# Patient Record
Sex: Male | Born: 1950
Health system: Southern US, Community
[De-identification: ages and names within clinical notes are randomized; demographics above are authoritative.]

## PROBLEM LIST (undated history)

## (undated) DIAGNOSIS — M1990 Unspecified osteoarthritis, unspecified site: Secondary | ICD-10-CM

## (undated) DIAGNOSIS — J189 Pneumonia, unspecified organism: Secondary | ICD-10-CM

## (undated) DIAGNOSIS — K219 Gastro-esophageal reflux disease without esophagitis: Secondary | ICD-10-CM

## (undated) DIAGNOSIS — H409 Unspecified glaucoma: Secondary | ICD-10-CM

## (undated) DIAGNOSIS — H269 Unspecified cataract: Secondary | ICD-10-CM

## (undated) DIAGNOSIS — E785 Hyperlipidemia, unspecified: Secondary | ICD-10-CM

## (undated) DIAGNOSIS — F419 Anxiety disorder, unspecified: Secondary | ICD-10-CM

## (undated) DIAGNOSIS — Z8709 Personal history of other diseases of the respiratory system: Secondary | ICD-10-CM

## (undated) DIAGNOSIS — H3530 Unspecified macular degeneration: Secondary | ICD-10-CM

## (undated) DIAGNOSIS — M2550 Pain in unspecified joint: Secondary | ICD-10-CM

## (undated) DIAGNOSIS — I10 Essential (primary) hypertension: Secondary | ICD-10-CM

## (undated) HISTORY — PX: OTHER SURGICAL HISTORY: SHX169

## (undated) HISTORY — PX: HEMORROIDECTOMY: SUR656

## (undated) HISTORY — PX: ESOPHAGOGASTRODUODENOSCOPY: SHX1529

## (undated) HISTORY — PX: COLONOSCOPY: SHX174

## (undated) HISTORY — PX: RETINAL DETACHMENT SURGERY: SHX105

---

## 1988-09-28 HISTORY — PX: NASAL SINUS SURGERY: SHX719

## 2010-09-28 DIAGNOSIS — J189 Pneumonia, unspecified organism: Secondary | ICD-10-CM

## 2010-09-28 HISTORY — DX: Pneumonia, unspecified organism: J18.9

## 2013-02-09 DIAGNOSIS — R5381 Other malaise: Secondary | ICD-10-CM | POA: Diagnosis not present

## 2013-02-09 DIAGNOSIS — B9789 Other viral agents as the cause of diseases classified elsewhere: Secondary | ICD-10-CM | POA: Diagnosis not present

## 2013-02-09 DIAGNOSIS — E785 Hyperlipidemia, unspecified: Secondary | ICD-10-CM | POA: Diagnosis not present

## 2013-02-09 DIAGNOSIS — I10 Essential (primary) hypertension: Secondary | ICD-10-CM | POA: Diagnosis not present

## 2013-02-10 DIAGNOSIS — I10 Essential (primary) hypertension: Secondary | ICD-10-CM | POA: Diagnosis not present

## 2013-02-10 DIAGNOSIS — E119 Type 2 diabetes mellitus without complications: Secondary | ICD-10-CM | POA: Diagnosis not present

## 2013-02-10 DIAGNOSIS — D649 Anemia, unspecified: Secondary | ICD-10-CM | POA: Diagnosis not present

## 2013-02-10 DIAGNOSIS — N390 Urinary tract infection, site not specified: Secondary | ICD-10-CM | POA: Diagnosis not present

## 2013-02-10 DIAGNOSIS — E039 Hypothyroidism, unspecified: Secondary | ICD-10-CM | POA: Diagnosis not present

## 2013-02-10 DIAGNOSIS — B9789 Other viral agents as the cause of diseases classified elsewhere: Secondary | ICD-10-CM | POA: Diagnosis not present

## 2013-04-13 DIAGNOSIS — H2510 Age-related nuclear cataract, unspecified eye: Secondary | ICD-10-CM | POA: Diagnosis not present

## 2013-04-13 DIAGNOSIS — H401290 Low-tension glaucoma, unspecified eye, stage unspecified: Secondary | ICD-10-CM | POA: Diagnosis not present

## 2014-05-30 ENCOUNTER — Other Ambulatory Visit: Payer: Self-pay | Admitting: Geriatric Medicine

## 2014-05-30 DIAGNOSIS — Z87891 Personal history of nicotine dependence: Secondary | ICD-10-CM | POA: Diagnosis not present

## 2014-05-30 DIAGNOSIS — R413 Other amnesia: Secondary | ICD-10-CM | POA: Diagnosis not present

## 2014-05-30 DIAGNOSIS — Z79899 Other long term (current) drug therapy: Secondary | ICD-10-CM | POA: Diagnosis not present

## 2014-05-30 DIAGNOSIS — K219 Gastro-esophageal reflux disease without esophagitis: Secondary | ICD-10-CM | POA: Diagnosis not present

## 2014-05-30 DIAGNOSIS — M810 Age-related osteoporosis without current pathological fracture: Secondary | ICD-10-CM | POA: Diagnosis not present

## 2014-05-30 DIAGNOSIS — E7800 Pure hypercholesterolemia, unspecified: Secondary | ICD-10-CM | POA: Diagnosis not present

## 2014-05-30 DIAGNOSIS — R109 Unspecified abdominal pain: Secondary | ICD-10-CM | POA: Diagnosis not present

## 2014-05-30 DIAGNOSIS — I10 Essential (primary) hypertension: Secondary | ICD-10-CM | POA: Diagnosis not present

## 2014-05-31 DIAGNOSIS — M1710 Unilateral primary osteoarthritis, unspecified knee: Secondary | ICD-10-CM | POA: Diagnosis not present

## 2014-05-31 DIAGNOSIS — IMO0002 Reserved for concepts with insufficient information to code with codable children: Secondary | ICD-10-CM | POA: Diagnosis not present

## 2014-05-31 DIAGNOSIS — M25469 Effusion, unspecified knee: Secondary | ICD-10-CM | POA: Diagnosis not present

## 2014-05-31 DIAGNOSIS — M25569 Pain in unspecified knee: Secondary | ICD-10-CM | POA: Diagnosis not present

## 2014-06-01 DIAGNOSIS — E7800 Pure hypercholesterolemia, unspecified: Secondary | ICD-10-CM | POA: Diagnosis not present

## 2014-06-01 DIAGNOSIS — R109 Unspecified abdominal pain: Secondary | ICD-10-CM | POA: Diagnosis not present

## 2014-06-01 DIAGNOSIS — I10 Essential (primary) hypertension: Secondary | ICD-10-CM | POA: Diagnosis not present

## 2014-06-01 DIAGNOSIS — Z79899 Other long term (current) drug therapy: Secondary | ICD-10-CM | POA: Diagnosis not present

## 2014-06-02 DIAGNOSIS — B029 Zoster without complications: Secondary | ICD-10-CM | POA: Diagnosis not present

## 2014-06-05 ENCOUNTER — Ambulatory Visit: Payer: Self-pay

## 2014-06-06 DIAGNOSIS — B0232 Zoster iridocyclitis: Secondary | ICD-10-CM | POA: Diagnosis not present

## 2014-06-11 DIAGNOSIS — M810 Age-related osteoporosis without current pathological fracture: Secondary | ICD-10-CM | POA: Diagnosis not present

## 2014-06-13 ENCOUNTER — Ambulatory Visit: Payer: Self-pay

## 2014-06-14 ENCOUNTER — Encounter (INDEPENDENT_AMBULATORY_CARE_PROVIDER_SITE_OTHER): Payer: Self-pay

## 2014-06-14 ENCOUNTER — Ambulatory Visit
Admission: RE | Admit: 2014-06-14 | Discharge: 2014-06-14 | Disposition: A | Discharge status: Home / Self Care | Payer: Medicare Other | Source: Ambulatory Visit | Attending: Geriatric Medicine | Admitting: Geriatric Medicine

## 2014-06-14 DIAGNOSIS — Z87891 Personal history of nicotine dependence: Secondary | ICD-10-CM

## 2014-06-14 DIAGNOSIS — Z136 Encounter for screening for cardiovascular disorders: Secondary | ICD-10-CM | POA: Diagnosis not present

## 2014-06-15 DIAGNOSIS — Z23 Encounter for immunization: Secondary | ICD-10-CM | POA: Diagnosis not present

## 2014-06-20 DIAGNOSIS — K219 Gastro-esophageal reflux disease without esophagitis: Secondary | ICD-10-CM | POA: Diagnosis not present

## 2014-06-20 DIAGNOSIS — M949 Disorder of cartilage, unspecified: Secondary | ICD-10-CM | POA: Diagnosis not present

## 2014-06-20 DIAGNOSIS — M899 Disorder of bone, unspecified: Secondary | ICD-10-CM | POA: Diagnosis not present

## 2014-06-20 DIAGNOSIS — Z79899 Other long term (current) drug therapy: Secondary | ICD-10-CM | POA: Diagnosis not present

## 2014-06-20 DIAGNOSIS — I10 Essential (primary) hypertension: Secondary | ICD-10-CM | POA: Diagnosis not present

## 2014-06-20 DIAGNOSIS — R413 Other amnesia: Secondary | ICD-10-CM | POA: Diagnosis not present

## 2014-06-20 DIAGNOSIS — M1990 Unspecified osteoarthritis, unspecified site: Secondary | ICD-10-CM | POA: Diagnosis not present

## 2014-06-22 DIAGNOSIS — M1710 Unilateral primary osteoarthritis, unspecified knee: Secondary | ICD-10-CM | POA: Diagnosis not present

## 2015-01-21 DIAGNOSIS — R1013 Epigastric pain: Secondary | ICD-10-CM | POA: Diagnosis not present

## 2015-01-21 DIAGNOSIS — M25562 Pain in left knee: Secondary | ICD-10-CM | POA: Diagnosis not present

## 2015-01-21 DIAGNOSIS — Z79899 Other long term (current) drug therapy: Secondary | ICD-10-CM | POA: Diagnosis not present

## 2015-01-21 DIAGNOSIS — I10 Essential (primary) hypertension: Secondary | ICD-10-CM | POA: Diagnosis not present

## 2015-01-21 DIAGNOSIS — E780 Pure hypercholesterolemia: Secondary | ICD-10-CM | POA: Diagnosis not present

## 2015-01-22 DIAGNOSIS — M1711 Unilateral primary osteoarthritis, right knee: Secondary | ICD-10-CM | POA: Diagnosis not present

## 2015-01-22 DIAGNOSIS — M1712 Unilateral primary osteoarthritis, left knee: Secondary | ICD-10-CM | POA: Diagnosis not present

## 2015-01-22 DIAGNOSIS — M25562 Pain in left knee: Secondary | ICD-10-CM | POA: Diagnosis not present

## 2015-01-22 DIAGNOSIS — M25561 Pain in right knee: Secondary | ICD-10-CM | POA: Diagnosis not present

## 2015-01-25 DIAGNOSIS — M1712 Unilateral primary osteoarthritis, left knee: Secondary | ICD-10-CM | POA: Diagnosis not present

## 2015-01-27 ENCOUNTER — Other Ambulatory Visit: Payer: Self-pay | Admitting: Orthopedic Surgery

## 2015-01-28 ENCOUNTER — Ambulatory Visit (HOSPITAL_COMMUNITY)
Admission: RE | Admit: 2015-01-28 | Discharge: 2015-01-28 | Disposition: A | Discharge status: Home / Self Care | Payer: Medicare Other | Source: Ambulatory Visit | Attending: Orthopedic Surgery | Admitting: Orthopedic Surgery

## 2015-01-28 ENCOUNTER — Encounter (HOSPITAL_COMMUNITY): Payer: Self-pay

## 2015-01-28 ENCOUNTER — Encounter (HOSPITAL_COMMUNITY)
Admission: RE | Admit: 2015-01-28 | Discharge: 2015-01-28 | Disposition: A | Discharge status: Home / Self Care | Payer: Medicare Other | Source: Ambulatory Visit | Attending: Orthopedic Surgery | Admitting: Orthopedic Surgery

## 2015-01-28 DIAGNOSIS — M1712 Unilateral primary osteoarthritis, left knee: Secondary | ICD-10-CM | POA: Diagnosis not present

## 2015-01-28 DIAGNOSIS — R9431 Abnormal electrocardiogram [ECG] [EKG]: Secondary | ICD-10-CM | POA: Diagnosis not present

## 2015-01-28 DIAGNOSIS — I498 Other specified cardiac arrhythmias: Secondary | ICD-10-CM | POA: Insufficient documentation

## 2015-01-28 DIAGNOSIS — Z01818 Encounter for other preprocedural examination: Secondary | ICD-10-CM

## 2015-01-28 DIAGNOSIS — Z01812 Encounter for preprocedural laboratory examination: Secondary | ICD-10-CM | POA: Diagnosis not present

## 2015-01-28 DIAGNOSIS — Z01811 Encounter for preprocedural respiratory examination: Secondary | ICD-10-CM | POA: Diagnosis not present

## 2015-01-28 HISTORY — DX: Unspecified osteoarthritis, unspecified site: M19.90

## 2015-01-28 HISTORY — DX: Personal history of other diseases of the respiratory system: Z87.09

## 2015-01-28 HISTORY — DX: Unspecified cataract: H26.9

## 2015-01-28 HISTORY — DX: Essential (primary) hypertension: I10

## 2015-01-28 HISTORY — DX: Unspecified glaucoma: H40.9

## 2015-01-28 HISTORY — DX: Pain in unspecified joint: M25.50

## 2015-01-28 HISTORY — DX: Gastro-esophageal reflux disease without esophagitis: K21.9

## 2015-01-28 HISTORY — DX: Pneumonia, unspecified organism: J18.9

## 2015-01-28 HISTORY — DX: Hyperlipidemia, unspecified: E78.5

## 2015-01-28 HISTORY — DX: Unspecified macular degeneration: H35.30

## 2015-01-28 HISTORY — DX: Anxiety disorder, unspecified: F41.9

## 2015-01-28 LAB — CBC WITH DIFFERENTIAL/PLATELET
BASOS PCT: 1 % (ref 0–1)
Basophils Absolute: 0.1 10*3/uL (ref 0.0–0.1)
EOS PCT: 8 % — AB (ref 0–5)
Eosinophils Absolute: 0.5 10*3/uL (ref 0.0–0.7)
HCT: 42.7 % (ref 39.0–52.0)
HEMOGLOBIN: 14.2 g/dL (ref 13.0–17.0)
LYMPHS ABS: 1.8 10*3/uL (ref 0.7–4.0)
Lymphocytes Relative: 30 % (ref 12–46)
MCH: 30.7 pg (ref 26.0–34.0)
MCHC: 33.3 g/dL (ref 30.0–36.0)
MCV: 92.2 fL (ref 78.0–100.0)
Monocytes Absolute: 0.4 10*3/uL (ref 0.1–1.0)
Monocytes Relative: 7 % (ref 3–12)
Neutro Abs: 3.3 10*3/uL (ref 1.7–7.7)
Neutrophils Relative %: 54 % (ref 43–77)
Platelets: 250 10*3/uL (ref 150–400)
RBC: 4.63 MIL/uL (ref 4.22–5.81)
RDW: 12.8 % (ref 11.5–15.5)
WBC: 6 10*3/uL (ref 4.0–10.5)

## 2015-01-28 LAB — URINE MICROSCOPIC-ADD ON

## 2015-01-28 LAB — COMPREHENSIVE METABOLIC PANEL
ALBUMIN: 3.6 g/dL (ref 3.5–5.0)
ALT: 27 U/L (ref 17–63)
AST: 27 U/L (ref 15–41)
Alkaline Phosphatase: 66 U/L (ref 38–126)
Anion gap: 6 (ref 5–15)
BUN: 8 mg/dL (ref 6–20)
CO2: 26 mmol/L (ref 22–32)
CREATININE: 0.98 mg/dL (ref 0.61–1.24)
Calcium: 9.1 mg/dL (ref 8.9–10.3)
Chloride: 104 mmol/L (ref 101–111)
GFR calc non Af Amer: >60 mL/min (ref >60)
GLUCOSE: 103 mg/dL — AB (ref 70–99)
Potassium: 4.1 mmol/L (ref 3.5–5.1)
Sodium: 136 mmol/L (ref 135–145)
TOTAL PROTEIN: 7.4 g/dL (ref 6.5–8.1)
Total Bilirubin: 0.7 mg/dL (ref 0.3–1.2)

## 2015-01-28 LAB — URINALYSIS, ROUTINE W REFLEX MICROSCOPIC
Bilirubin Urine: NEGATIVE
Glucose, UA: NEGATIVE mg/dL
Hgb urine dipstick: NEGATIVE
KETONES UR: NEGATIVE mg/dL
Nitrite: NEGATIVE
Protein, ur: NEGATIVE mg/dL
Specific Gravity, Urine: 1.003 — ABNORMAL LOW (ref 1.005–1.030)
UROBILINOGEN UA: 0.2 mg/dL (ref 0.0–1.0)
pH: 6.5 (ref 5.0–8.0)

## 2015-01-28 LAB — SURGICAL PCR SCREEN
MRSA, PCR: NEGATIVE
Staphylococcus aureus: NEGATIVE

## 2015-01-28 LAB — APTT: aPTT: 30 seconds (ref 24–37)

## 2015-01-28 LAB — PROTIME-INR
INR: 1.08 (ref 0.00–1.49)
PROTHROMBIN TIME: 14.1 s (ref 11.6–15.2)

## 2015-01-28 MED ORDER — CHLORHEXIDINE GLUCONATE 4 % EX LIQD
60.0000 mL | Freq: Once | CUTANEOUS | Status: DC
  Filled 2015-01-28: qty 60

## 2015-01-28 NOTE — Progress Notes (Signed)
Pt doesn't have a cardiologist  Denies ever having an echo/stress test/heart cath  Denies EKG or CXR in past yr  Medical Md is DR.Middleton

## 2015-01-28 NOTE — Pre-Procedure Instructions (Signed)
Hunter Crane  01/28/2015   Your procedure is scheduled on:  Mon, May 9 @ 8:45 AM  Report to [facility]  at 6:45 AM.  Call this number if you have problems the morning of surgery: [phone]   Remember:   Do not eat food or drink liquids after midnight.   Take these medicines the morning of surgery with A SIP OF WATER: Amlodipine(Norvasc)              Stop taking your Aspirin,Fish Oil,and any Vitamins. No Goody's,BC's,Aleve,Ibuprofen,or Herbal Medications.    Do not wear jewelry.  Do not wear lotions, powders, or colognes. You may wear deodorant.  Men may shave face and neck.  Do not bring valuables to the hospital.  [facility] is not responsible                  for any belongings or valuables.               Contacts, dentures or bridgework may not be worn into surgery.  Leave suitcase in the car. After surgery it may be brought to your room.  For patients admitted to the hospital, discharge time is determined by your                treatment team.                 Special Instructions:  [place] - Preparing for Surgery  Before surgery, you can play an important role.  Because skin is not sterile, your skin needs to be as free of germs as possible.  You can reduce the number of germs on you skin by washing with CHG (chlorahexidine gluconate) soap before surgery.  CHG is an antiseptic cleaner which kills germs and bonds with the skin to continue killing germs even after washing.  Please DO NOT use if you have an allergy to CHG or antibacterial soaps.  If your skin becomes reddened/irritated stop using the CHG and inform your nurse when you arrive at Short Stay.  Do not shave (including legs and underarms) for at least 48 hours prior to the first CHG shower.  You may shave your face.  Please follow these instructions carefully:   1.  Shower with CHG Soap the night before surgery and the                                morning of Surgery.  2.  If you choose to wash  your hair, wash your hair first as usual with your       normal shampoo.  3.  After you shampoo, rinse your hair and body thoroughly to remove the                      Shampoo.  4.  Use CHG as you would any other liquid soap.  You can apply chg directly       to the skin and wash gently with scrungie or a clean washcloth.  5.  Apply the CHG Soap to your body ONLY FROM THE NECK DOWN.        Do not use on open wounds or open sores.  Avoid contact with your eyes,       ears, mouth and genitals (private parts).  Wash genitals (private parts)       with your normal soap.  6.  Wash  thoroughly, paying special attention to the area where your surgery        will be performed.  7.  Thoroughly rinse your body with warm water from the neck down.  8.  DO NOT shower/wash with your normal soap after using and rinsing off       the CHG Soap.  9.  Pat yourself dry with a clean towel.            10.  Wear clean pajamas.            11.  Place clean sheets on your bed the night of your first shower and do not        sleep with pets.  Day of Surgery  Do not apply any lotions/deoderants the morning of surgery.  Please wear clean clothes to the hospital/surgery center.     Please read over the following fact sheets that you were given: Pain Booklet, Coughing and Deep Breathing, MRSA Information and Surgical Site Infection Prevention

## 2015-01-29 LAB — URINE CULTURE
COLONY COUNT: NO GROWTH
Culture: NO GROWTH

## 2015-02-01 MED ORDER — CEFAZOLIN SODIUM-DEXTROSE 2-3 GM-% IV SOLR
2.0000 g | INTRAVENOUS | Status: AC
  Administered 2015-02-04: 2 g via INTRAVENOUS
  Filled 2015-02-01: qty 50

## 2015-02-01 MED ORDER — TRANEXAMIC ACID 1000 MG/10ML IV SOLN
1000.0000 mg | INTRAVENOUS | Status: AC
  Administered 2015-02-04: 1000 mg via INTRAVENOUS
  Filled 2015-02-01: qty 10

## 2015-02-01 MED ORDER — BUPIVACAINE LIPOSOME 1.3 % IJ SUSP
20.0000 mL | INTRAMUSCULAR | Status: AC
  Administered 2015-02-04: 20 mL
  Filled 2015-02-01: qty 20

## 2015-02-01 MED ORDER — SODIUM CHLORIDE 0.9 % IV SOLN: INTRAVENOUS | Status: DC

## 2015-02-04 ENCOUNTER — Encounter (HOSPITAL_COMMUNITY): Payer: Self-pay | Admitting: Anesthesiology

## 2015-02-04 ENCOUNTER — Inpatient Hospital Stay (HOSPITAL_COMMUNITY)
Admission: RE | Admit: 2015-02-04 | Discharge: 2015-02-05 | DRG: 470 | Disposition: A | Discharge status: Home Health | Payer: Medicare Other | Source: Ambulatory Visit | Attending: Orthopedic Surgery | Admitting: Orthopedic Surgery

## 2015-02-04 ENCOUNTER — Encounter (HOSPITAL_COMMUNITY)
Admission: RE | Disposition: A | Discharge status: Home / Self Care | Payer: Self-pay | Source: Ambulatory Visit | Attending: Orthopedic Surgery

## 2015-02-04 ENCOUNTER — Inpatient Hospital Stay (HOSPITAL_COMMUNITY): Payer: Medicare Other | Admitting: Anesthesiology

## 2015-02-04 DIAGNOSIS — D62 Acute posthemorrhagic anemia: Secondary | ICD-10-CM | POA: Diagnosis not present

## 2015-02-04 DIAGNOSIS — Z79899 Other long term (current) drug therapy: Secondary | ICD-10-CM | POA: Diagnosis not present

## 2015-02-04 DIAGNOSIS — H3530 Unspecified macular degeneration: Secondary | ICD-10-CM | POA: Diagnosis present

## 2015-02-04 DIAGNOSIS — Z7982 Long term (current) use of aspirin: Secondary | ICD-10-CM | POA: Diagnosis not present

## 2015-02-04 DIAGNOSIS — I10 Essential (primary) hypertension: Secondary | ICD-10-CM | POA: Diagnosis present

## 2015-02-04 DIAGNOSIS — E785 Hyperlipidemia, unspecified: Secondary | ICD-10-CM | POA: Diagnosis present

## 2015-02-04 DIAGNOSIS — M1712 Unilateral primary osteoarthritis, left knee: Principal | ICD-10-CM | POA: Diagnosis present

## 2015-02-04 DIAGNOSIS — F419 Anxiety disorder, unspecified: Secondary | ICD-10-CM | POA: Diagnosis present

## 2015-02-04 DIAGNOSIS — H409 Unspecified glaucoma: Secondary | ICD-10-CM | POA: Diagnosis present

## 2015-02-04 DIAGNOSIS — G8918 Other acute postprocedural pain: Secondary | ICD-10-CM | POA: Diagnosis not present

## 2015-02-04 DIAGNOSIS — Z96659 Presence of unspecified artificial knee joint: Secondary | ICD-10-CM

## 2015-02-04 DIAGNOSIS — M179 Osteoarthritis of knee, unspecified: Secondary | ICD-10-CM | POA: Diagnosis not present

## 2015-02-04 DIAGNOSIS — M25562 Pain in left knee: Secondary | ICD-10-CM | POA: Diagnosis not present

## 2015-02-04 DIAGNOSIS — K219 Gastro-esophageal reflux disease without esophagitis: Secondary | ICD-10-CM | POA: Diagnosis present

## 2015-02-04 HISTORY — PX: TOTAL KNEE ARTHROPLASTY: SHX125

## 2015-02-04 LAB — CBC
HEMATOCRIT: 42.2 % (ref 39.0–52.0)
Hemoglobin: 13.7 g/dL (ref 13.0–17.0)
MCH: 30.2 pg (ref 26.0–34.0)
MCHC: 32.5 g/dL (ref 30.0–36.0)
MCV: 93.2 fL (ref 78.0–100.0)
Platelets: 266 10*3/uL (ref 150–400)
RBC: 4.53 MIL/uL (ref 4.22–5.81)
RDW: 12.9 % (ref 11.5–15.5)
WBC: 10.6 10*3/uL — ABNORMAL HIGH (ref 4.0–10.5)

## 2015-02-04 LAB — CREATININE, SERUM
Creatinine, Ser: 0.93 mg/dL (ref 0.61–1.24)
GFR calc non Af Amer: >60 mL/min (ref >60)

## 2015-02-04 SURGERY — ARTHROPLASTY, KNEE, TOTAL
Anesthesia: Regional | Site: Knee | Laterality: Left

## 2015-02-04 MED ORDER — PRAVASTATIN SODIUM 20 MG PO TABS
20.0000 mg | ORAL_TABLET | Freq: Every day | ORAL | Status: DC
  Filled 2015-02-04: qty 1

## 2015-02-04 MED ORDER — METHOCARBAMOL 500 MG PO TABS
500.0000 mg | ORAL_TABLET | Freq: Four times a day (QID) | ORAL | Status: DC | PRN
  Administered 2015-02-04 – 2015-02-05 (×3): 500 mg via ORAL
  Filled 2015-02-04 (×4): qty 1

## 2015-02-04 MED ORDER — PROPOFOL 10 MG/ML IV BOLUS
INTRAVENOUS | Status: AC
  Filled 2015-02-04: qty 20

## 2015-02-04 MED ORDER — LACTATED RINGERS IV SOLN
INTRAVENOUS | Status: DC
  Administered 2015-02-04 (×2): via INTRAVENOUS

## 2015-02-04 MED ORDER — AMLODIPINE BESYLATE 5 MG PO TABS
5.0000 mg | ORAL_TABLET | Freq: Every day | ORAL | Status: DC
  Administered 2015-02-05: 5 mg via ORAL
  Filled 2015-02-04: qty 1

## 2015-02-04 MED ORDER — FENTANYL CITRATE (PF) 100 MCG/2ML IJ SOLN
100.0000 ug | Freq: Once | INTRAMUSCULAR | Status: DC
  Filled 2015-02-04: qty 2

## 2015-02-04 MED ORDER — OXYCODONE HCL 5 MG PO TABS
5.0000 mg | ORAL_TABLET | ORAL | Status: DC | PRN
  Administered 2015-02-04 – 2015-02-05 (×4): 10 mg via ORAL
  Filled 2015-02-04 (×3): qty 2

## 2015-02-04 MED ORDER — DOCUSATE SODIUM 100 MG PO CAPS
100.0000 mg | ORAL_CAPSULE | Freq: Two times a day (BID) | ORAL | Status: DC
  Administered 2015-02-04 – 2015-02-05 (×3): 100 mg via ORAL
  Filled 2015-02-04 (×3): qty 1

## 2015-02-04 MED ORDER — CEFAZOLIN SODIUM-DEXTROSE 2-3 GM-% IV SOLR
2.0000 g | Freq: Four times a day (QID) | INTRAVENOUS | Status: AC
  Administered 2015-02-04 (×2): 2 g via INTRAVENOUS
  Filled 2015-02-04 (×2): qty 50

## 2015-02-04 MED ORDER — ALPRAZOLAM 0.5 MG PO TABS
0.5000 mg | ORAL_TABLET | Freq: Every day | ORAL | Status: DC
  Administered 2015-02-04: 0.5 mg via ORAL
  Filled 2015-02-04: qty 1

## 2015-02-04 MED ORDER — PHENYLEPHRINE HCL 10 MG/ML IJ SOLN
INTRAMUSCULAR | Status: DC | PRN
  Administered 2015-02-04: 40 ug via INTRAVENOUS

## 2015-02-04 MED ORDER — ACETAMINOPHEN 650 MG RE SUPP: 650.0000 mg | Freq: Four times a day (QID) | RECTAL | Status: DC | PRN

## 2015-02-04 MED ORDER — HYDROMORPHONE HCL 1 MG/ML IJ SOLN
INTRAMUSCULAR | Status: AC
  Filled 2015-02-04: qty 1

## 2015-02-04 MED ORDER — LACTATED RINGERS IV SOLN
INTRAVENOUS | Status: DC
  Administered 2015-02-04: 08:00:00 via INTRAVENOUS

## 2015-02-04 MED ORDER — FLEET ENEMA 7-19 GM/118ML RE ENEM: 1.0000 | ENEMA | Freq: Once | RECTAL | Status: AC | PRN

## 2015-02-04 MED ORDER — EPHEDRINE SULFATE 50 MG/ML IJ SOLN
INTRAMUSCULAR | Status: DC | PRN
  Administered 2015-02-04 (×2): 10 mg via INTRAVENOUS

## 2015-02-04 MED ORDER — BISACODYL 5 MG PO TBEC: 5.0000 mg | DELAYED_RELEASE_TABLET | Freq: Every day | ORAL | Status: DC | PRN

## 2015-02-04 MED ORDER — ONDANSETRON HCL 4 MG/2ML IJ SOLN: 4.0000 mg | Freq: Four times a day (QID) | INTRAMUSCULAR | Status: DC | PRN

## 2015-02-04 MED ORDER — ATORVASTATIN CALCIUM 10 MG PO TABS: 10.0000 mg | ORAL_TABLET | Freq: Every day | ORAL | Status: DC

## 2015-02-04 MED ORDER — SODIUM CHLORIDE 0.9 % IR SOLN
Status: DC | PRN
  Administered 2015-02-04 (×2): 1000 mL

## 2015-02-04 MED ORDER — LIDOCAINE HCL 4 % MT SOLN
OROMUCOSAL | Status: DC | PRN
  Administered 2015-02-04: 4 mL via TOPICAL

## 2015-02-04 MED ORDER — PHENOL 1.4 % MT LIQD: 1.0000 | OROMUCOSAL | Status: DC | PRN

## 2015-02-04 MED ORDER — HYDROMORPHONE HCL 1 MG/ML IJ SOLN
0.5000 mg | INTRAMUSCULAR | Status: DC | PRN
  Administered 2015-02-04 (×2): 0.5 mg via INTRAVENOUS

## 2015-02-04 MED ORDER — FENTANYL CITRATE (PF) 250 MCG/5ML IJ SOLN
INTRAMUSCULAR | Status: AC
  Filled 2015-02-04: qty 5

## 2015-02-04 MED ORDER — ENOXAPARIN SODIUM 30 MG/0.3ML ~~LOC~~ SOLN
30.0000 mg | Freq: Two times a day (BID) | SUBCUTANEOUS | Status: DC
Start: 2015-02-05 — End: 2015-02-05
  Administered 2015-02-05: 30 mg via SUBCUTANEOUS
  Filled 2015-02-04: qty 0.3

## 2015-02-04 MED ORDER — LATANOPROST 0.005 % OP SOLN
1.0000 [drp] | Freq: Every day | OPHTHALMIC | Status: DC
  Filled 2015-02-04: qty 2.5

## 2015-02-04 MED ORDER — OXYCODONE HCL ER 10 MG PO T12A
10.0000 mg | EXTENDED_RELEASE_TABLET | Freq: Two times a day (BID) | ORAL | Status: DC
  Administered 2015-02-04 – 2015-02-05 (×3): 10 mg via ORAL
  Filled 2015-02-04 (×3): qty 1

## 2015-02-04 MED ORDER — ALUM & MAG HYDROXIDE-SIMETH 200-200-20 MG/5ML PO SUSP: 30.0000 mL | ORAL | Status: DC | PRN

## 2015-02-04 MED ORDER — BUPIVACAINE-EPINEPHRINE 0.5% -1:200000 IJ SOLN
INTRAMUSCULAR | Status: DC | PRN
  Administered 2015-02-04: 30 mL

## 2015-02-04 MED ORDER — 0.9 % SODIUM CHLORIDE (POUR BTL) OPTIME
TOPICAL | Status: DC | PRN
  Administered 2015-02-04: 1000 mL

## 2015-02-04 MED ORDER — TRANEXAMIC ACID 1000 MG/10ML IV SOLN: 1000.0000 mg | Freq: Once | INTRAVENOUS | Status: DC

## 2015-02-04 MED ORDER — DIPHENHYDRAMINE HCL 12.5 MG/5ML PO ELIX: 12.5000 mg | ORAL_SOLUTION | ORAL | Status: DC | PRN

## 2015-02-04 MED ORDER — ONDANSETRON HCL 4 MG/2ML IJ SOLN
INTRAMUSCULAR | Status: DC | PRN
  Administered 2015-02-04: 4 mg via INTRAVENOUS

## 2015-02-04 MED ORDER — ONDANSETRON HCL 4 MG PO TABS: 4.0000 mg | ORAL_TABLET | Freq: Four times a day (QID) | ORAL | Status: DC | PRN

## 2015-02-04 MED ORDER — PROPOFOL 10 MG/ML IV BOLUS
INTRAVENOUS | Status: DC | PRN
  Administered 2015-02-04: 200 mg via INTRAVENOUS

## 2015-02-04 MED ORDER — ARTIFICIAL TEARS OP OINT
TOPICAL_OINTMENT | OPHTHALMIC | Status: DC | PRN
  Administered 2015-02-04: 1 via OPHTHALMIC

## 2015-02-04 MED ORDER — SODIUM CHLORIDE 0.9 % IV SOLN
INTRAVENOUS | Status: DC
  Administered 2015-02-04 – 2015-02-05 (×2): via INTRAVENOUS

## 2015-02-04 MED ORDER — MIDAZOLAM HCL 2 MG/2ML IJ SOLN
INTRAMUSCULAR | Status: AC
  Filled 2015-02-04: qty 2

## 2015-02-04 MED ORDER — METOCLOPRAMIDE HCL 5 MG/ML IJ SOLN
5.0000 mg | Freq: Three times a day (TID) | INTRAMUSCULAR | Status: DC | PRN
Start: 2015-02-04 — End: 2015-02-05

## 2015-02-04 MED ORDER — SENNOSIDES-DOCUSATE SODIUM 8.6-50 MG PO TABS: 1.0000 | ORAL_TABLET | Freq: Every evening | ORAL | Status: DC | PRN

## 2015-02-04 MED ORDER — PANTOPRAZOLE SODIUM 40 MG PO TBEC
40.0000 mg | DELAYED_RELEASE_TABLET | Freq: Every day | ORAL | Status: DC
  Filled 2015-02-04: qty 1

## 2015-02-04 MED ORDER — ONDANSETRON HCL 4 MG/2ML IJ SOLN: 4.0000 mg | Freq: Once | INTRAMUSCULAR | Status: DC | PRN

## 2015-02-04 MED ORDER — FENTANYL CITRATE (PF) 100 MCG/2ML IJ SOLN
INTRAMUSCULAR | Status: DC | PRN
  Administered 2015-02-04 (×2): 25 ug via INTRAVENOUS
  Administered 2015-02-04: 50 ug via INTRAVENOUS
  Administered 2015-02-04: 125 ug via INTRAVENOUS
  Administered 2015-02-04: 25 ug via INTRAVENOUS

## 2015-02-04 MED ORDER — METOCLOPRAMIDE HCL 5 MG PO TABS: 5.0000 mg | ORAL_TABLET | Freq: Three times a day (TID) | ORAL | Status: DC | PRN

## 2015-02-04 MED ORDER — HYDROMORPHONE HCL 1 MG/ML IJ SOLN
0.5000 mg | INTRAMUSCULAR | Status: AC | PRN
  Administered 2015-02-04 (×4): 0.5 mg via INTRAVENOUS

## 2015-02-04 MED ORDER — METHOCARBAMOL 1000 MG/10ML IJ SOLN
500.0000 mg | Freq: Four times a day (QID) | INTRAVENOUS | Status: DC | PRN
  Administered 2015-02-04: 500 mg via INTRAVENOUS
  Filled 2015-02-04 (×2): qty 5

## 2015-02-04 MED ORDER — ACETAMINOPHEN 325 MG PO TABS: 650.0000 mg | ORAL_TABLET | Freq: Four times a day (QID) | ORAL | Status: DC | PRN

## 2015-02-04 MED ORDER — MENTHOL 3 MG MT LOZG: 1.0000 | LOZENGE | OROMUCOSAL | Status: DC | PRN

## 2015-02-04 MED ORDER — FENTANYL CITRATE (PF) 100 MCG/2ML IJ SOLN
INTRAMUSCULAR | Status: AC
  Administered 2015-02-04: 100 ug
  Filled 2015-02-04: qty 2

## 2015-02-04 MED ORDER — HYDROMORPHONE HCL 1 MG/ML IJ SOLN
1.0000 mg | INTRAMUSCULAR | Status: DC | PRN
  Administered 2015-02-04 (×2): 1 mg via INTRAVENOUS
  Filled 2015-02-04 (×2): qty 1

## 2015-02-04 MED ORDER — SUCCINYLCHOLINE CHLORIDE 20 MG/ML IJ SOLN
INTRAMUSCULAR | Status: DC | PRN
  Administered 2015-02-04: 90 mg via INTRAVENOUS

## 2015-02-04 MED ORDER — CELECOXIB 200 MG PO CAPS
200.0000 mg | ORAL_CAPSULE | Freq: Two times a day (BID) | ORAL | Status: DC
  Administered 2015-02-04 – 2015-02-05 (×3): 200 mg via ORAL
  Filled 2015-02-04 (×3): qty 1

## 2015-02-04 MED ORDER — OXYCODONE HCL 5 MG PO TABS
ORAL_TABLET | ORAL | Status: AC
  Filled 2015-02-04: qty 2

## 2015-02-04 MED ORDER — LIDOCAINE HCL (CARDIAC) 20 MG/ML IV SOLN
INTRAVENOUS | Status: DC | PRN
  Administered 2015-02-04: 75 mg via INTRAVENOUS

## 2015-02-04 MED ORDER — ZOLPIDEM TARTRATE 5 MG PO TABS: 5.0000 mg | ORAL_TABLET | Freq: Every evening | ORAL | Status: DC | PRN

## 2015-02-04 SURGICAL SUPPLY — 58 items
BANDAGE ESMARK 6X9 LF (GAUZE/BANDAGES/DRESSINGS) ×1 IMPLANT
BLADE SAGITTAL 13X1.27X60 (BLADE) ×2 IMPLANT
BLADE SAGITTAL 13X1.27X60MM (BLADE) ×1
BLADE SAW SGTL 83.5X18.5 (BLADE) ×3 IMPLANT
BLADE SURG 10 STRL SS (BLADE) ×3 IMPLANT
BNDG ESMARK 6X9 LF (GAUZE/BANDAGES/DRESSINGS) ×3
BOWL SMART MIX CTS (DISPOSABLE) ×3 IMPLANT
CAPT KNEE TOTAL 3 ×3 IMPLANT
CEMENT BONE SIMPLEX SPEEDSET (Cement) ×6 IMPLANT
COVER SURGICAL LIGHT HANDLE (MISCELLANEOUS) ×3 IMPLANT
CUFF TOURNIQUET SINGLE 34IN LL (TOURNIQUET CUFF) ×3 IMPLANT
DRAPE EXTREMITY T 121X128X90 (DRAPE) ×3 IMPLANT
DRAPE IMP U-DRAPE 54X76 (DRAPES) ×3 IMPLANT
DRAPE INCISE IOBAN 66X45 STRL (DRAPES) ×6 IMPLANT
DRAPE PROXIMA HALF (DRAPES) IMPLANT
DRAPE U-SHAPE 47X51 STRL (DRAPES) ×3 IMPLANT
DRSG ADAPTIC 3X8 NADH LF (GAUZE/BANDAGES/DRESSINGS) ×3 IMPLANT
DRSG PAD ABDOMINAL 8X10 ST (GAUZE/BANDAGES/DRESSINGS) ×3 IMPLANT
DURAPREP 26ML APPLICATOR (WOUND CARE) ×6 IMPLANT
ELECT REM PT RETURN 9FT ADLT (ELECTROSURGICAL) ×3
ELECTRODE REM PT RTRN 9FT ADLT (ELECTROSURGICAL) ×1 IMPLANT
GAUZE SPONGE 4X4 12PLY STRL (GAUZE/BANDAGES/DRESSINGS) ×3 IMPLANT
GLOVE BIOGEL M 7.0 STRL (GLOVE) ×3 IMPLANT
GLOVE BIOGEL PI IND STRL 7.5 (GLOVE) ×2 IMPLANT
GLOVE BIOGEL PI IND STRL 8.5 (GLOVE) ×1 IMPLANT
GLOVE BIOGEL PI INDICATOR 7.5 (GLOVE) ×4
GLOVE BIOGEL PI INDICATOR 8.5 (GLOVE) ×2
GLOVE SURG ORTHO 8.0 STRL STRW (GLOVE) ×6 IMPLANT
GOWN STRL REUS W/ TWL LRG LVL3 (GOWN DISPOSABLE) ×1 IMPLANT
GOWN STRL REUS W/ TWL XL LVL3 (GOWN DISPOSABLE) ×2 IMPLANT
GOWN STRL REUS W/TWL LRG LVL3 (GOWN DISPOSABLE) ×2
GOWN STRL REUS W/TWL XL LVL3 (GOWN DISPOSABLE) ×4
HANDPIECE INTERPULSE COAX TIP (DISPOSABLE) ×2
HOOD PEEL AWAY FACE SHEILD DIS (HOOD) ×9 IMPLANT
KIT BASIN OR (CUSTOM PROCEDURE TRAY) ×3 IMPLANT
KIT ROOM TURNOVER OR (KITS) ×3 IMPLANT
KNEE CAPITATED TOTAL 3 ×1 IMPLANT
MANIFOLD NEPTUNE II (INSTRUMENTS) ×3 IMPLANT
NEEDLE 22X1 1/2 (OR ONLY) (NEEDLE) ×6 IMPLANT
NS IRRIG 1000ML POUR BTL (IV SOLUTION) ×3 IMPLANT
PACK TOTAL JOINT (CUSTOM PROCEDURE TRAY) ×3 IMPLANT
PACK UNIVERSAL I (CUSTOM PROCEDURE TRAY) ×3 IMPLANT
PAD ARMBOARD 7.5X6 YLW CONV (MISCELLANEOUS) ×6 IMPLANT
PADDING CAST COTTON 6X4 STRL (CAST SUPPLIES) ×3 IMPLANT
SET HNDPC FAN SPRY TIP SCT (DISPOSABLE) ×1 IMPLANT
SPONGE GAUZE 4X4 12PLY STER LF (GAUZE/BANDAGES/DRESSINGS) ×3 IMPLANT
STAPLER VISISTAT 35W (STAPLE) ×3 IMPLANT
SUCTION FRAZIER TIP 10 FR DISP (SUCTIONS) ×3 IMPLANT
SUT BONE WAX W31G (SUTURE) ×3 IMPLANT
SUT VIC AB 0 CTB1 27 (SUTURE) ×6 IMPLANT
SUT VIC AB 1 CT1 27 (SUTURE) ×6
SUT VIC AB 1 CT1 27XBRD ANBCTR (SUTURE) ×3 IMPLANT
SUT VIC AB 2-0 CT1 27 (SUTURE) ×4
SUT VIC AB 2-0 CT1 TAPERPNT 27 (SUTURE) ×2 IMPLANT
SYR 20CC LL (SYRINGE) ×6 IMPLANT
TOWEL OR 17X24 6PK STRL BLUE (TOWEL DISPOSABLE) ×3 IMPLANT
TOWEL OR 17X26 10 PK STRL BLUE (TOWEL DISPOSABLE) ×3 IMPLANT
WATER STERILE IRR 1000ML POUR (IV SOLUTION) IMPLANT

## 2015-02-04 NOTE — Transfer of Care (Signed)
Immediate Anesthesia Transfer of Care Note  Patient: Hunter Crane  Procedure(s) Performed: Procedure(s): LEFT TOTAL KNEE ARTHROPLASTY (Left)  Patient Location: PACU  Anesthesia Type:GA combined with regional for post-op pain  Level of Consciousness: awake and alert   Airway & Oxygen Therapy: Patient Spontanous Breathing and Patient connected to nasal cannula oxygen  Post-op Assessment: Report given to RN, Post -op Vital signs reviewed and stable and Patient moving all extremities  Post vital signs: Reviewed and stable  Last Vitals:  Filed Vitals:   02/04/15 0815  BP: 145/90  Pulse: 87  Temp:   Resp: 21    Complications: No apparent anesthesia complications

## 2015-02-04 NOTE — Anesthesia Preprocedure Evaluation (Addendum)
Anesthesia Evaluation  Patient identified by MRN, date of birth, ID band Patient awake    Reviewed: Allergy & Precautions, NPO status , Patient's Chart, lab work & pertinent test results  Airway Mallampati: I       Dental   Pulmonary    Pulmonary exam normal       Cardiovascular hypertension, Normal cardiovascular examRhythm:Regular Rate:Normal     Neuro/Psych    GI/Hepatic GERD-  ,  Endo/Other    Renal/GU      Musculoskeletal  (+) Arthritis -,   Abdominal   Peds  Hematology   Anesthesia Other Findings   Reproductive/Obstetrics                            Anesthesia Physical Anesthesia Plan  ASA: II  Anesthesia Plan: General and Regional   Post-op Pain Management:    Induction: Intravenous  Airway Management Planned: Oral ETT  Additional Equipment:   Intra-op Plan:   Post-operative Plan: Extubation in OR  Informed Consent: I have reviewed the patients History and Physical, chart, labs and discussed the procedure including the risks, benefits and alternatives for the proposed anesthesia with the patient or authorized representative who has indicated his/her understanding and acceptance.     Plan Discussed with: CRNA, Surgeon and Anesthesiologist  Anesthesia Plan Comments:        Anesthesia Quick Evaluation

## 2015-02-04 NOTE — Progress Notes (Signed)
Utilization review completed.  

## 2015-02-04 NOTE — Op Note (Signed)
TOTAL KNEE REPLACEMENT OPERATIVE NOTE:  02/04/2015  1:57 PM  PATIENT:  Hunter Crane  64 y.o. male  PRE-OPERATIVE DIAGNOSIS:  primary left knee osteoarthritis   POST-OPERATIVE DIAGNOSIS:  primary left knee osteoarthritis   PROCEDURE:  Procedure(s): LEFT TOTAL KNEE ARTHROPLASTY  SURGEON:  Surgeon(s): Vickey Huger, MD  PHYSICIAN ASSISTANT: Carlynn Spry, [facility]  ANESTHESIA:   spinal  DRAINS: Hemovac  SPECIMEN: None  COUNTS:  Correct  TOURNIQUET:   Total Tourniquet Time Documented: Thigh (Left) - 53 minutes Total: Thigh (Left) - 53 minutes   DICTATION:  Indication for procedure:    The patient is a 64 y.o. male who has failed conservative treatment for primary left knee osteoarthritis .  Informed consent was obtained prior to anesthesia. The risks versus benefits of the operation were explain and in a way the patient can, and did, understand.   On the implant demand matching protocol, this patient scored 10.  Therefore, this patient did" "did not receive a polyethylene insert with vitamin E which is a high demand implant.  Description of procedure:     The patient was taken to the operating room and placed under anesthesia.  The patient was positioned in the usual fashion taking care that all body parts were adequately padded and/or protected.  I foley catheter was not placed.  A tourniquet was applied and the leg prepped and draped in the usual sterile fashion.  The extremity was exsanguinated with the esmarch and tourniquet inflated to 350 mmHg.  Pre-operative range of motion was normal.  The knee was in 3 degree of mild varus.  A midline incision approximately 6-7 inches long was made with a #10 blade.  A new blade was used to make a parapatellar arthrotomy going 2-3 cm into the quadriceps tendon, over the patella, and alongside the medial aspect of the patellar tendon.  A synovectomy was then performed with the #10 blade and forceps. I then elevated the deep MCL off the  medial tibial metaphysis subperiosteally around to the semimembranosus attachment.    I everted the patella and used calipers to measure patellar thickness.  I used the reamer to ream down to appropriate thickness to recreate the native thickness.  I then removed excess bone with the rongeur and sagittal saw.  I used the appropriately sized template and drilled the three lug holes.  I then put the trial in place and measured the thickness with the calipers to ensure recreation of the native thickness.  The trial was then removed and the patella subluxed and the knee brought into flexion.  A homan retractor was place to retract and protect the patella and lateral structures.  A Z-retractor was place medially to protect the medial structures.  The extra-medullary alignment system was used to make cut the tibial articular surface perpendicular to the anamotic axis of the tibia and in 3 degrees of posterior slope.  The cut surface and alignment jig was removed.  I then used the intramedullary alignment guide to make a 6 valgus cut on the distal femur.  I then marked out the epicondylar axis on the distal femur.  The posterior condylar axis measured 3 degrees.  I then used the anterior referencing sizer and measured the femur to be a size 7.  The 4-In-1 cutting block was screwed into place in external rotation matching the posterior condylar angle, making our cuts perpendicular to the epicondylar axis.  Anterior, posterior and chamfer cuts were made with the sagittal saw.  The cutting block  and cut pieces were removed.  A lamina spreader was placed in 90 degrees of flexion.  The ACL, PCL, menisci, and posterior condylar osteophytes were removed.  A 12 mm spacer blocked was found to offer good flexion and extension gap balance after mild in degree releasing.   The scoop retractor was then placed and the femoral finishing block was pinned in place.  The small sagittal saw was used as well as the lug drill to finish  the femur.  The block and cut surfaces were removed and the medullary canal hole filled with autograft bone from the cut pieces.  The tibia was delivered forward in deep flexion and external rotation.  A size E tray was selected and pinned into place centered on the medial 1/3 of the tibial tubercle.  The reamer and keel was used to prepare the tibia through the tray.    I then trialed with the size 7 femur, size E tibia, a 12 mm insert and the 32 patella.  I had excellent flexion/extension gap balance, excellent patella tracking.  Flexion was full and beyond 120 degrees; extension was zero.  These components were chosen and the staff opened them to me on the back table while the knee was lavaged copiously and the cement mixed.  The soft tissue was infiltrated with 60cc of exparel 1.3% through a 21 gauge needle.  I cemented in the components and removed all excess cement.  The polyethylene tibial component was snapped into place and the knee placed in extension while cement was hardening.  The capsule was infilltrated with 30cc of .25% Marcaine with epinephrine.  A hemovac was place in the joint exiting superolaterally.  A pain pump was place superomedially superficial to the arthrotomy.  Once the cement was hard, the tourniquet was let down.  Hemostasis was obtained.  The arthrotomy was closed with figure-8 #1 vicryl sutures.  The deep soft tissues were closed with #0 vicryls and the subcuticular layer closed with a running #2-0 vicryl.  The skin was reapproximated and closed with skin staples.  The wound was dressed with xeroform, 4 x4's, 2 ABD sponges, a single layer of webril and a TED stocking.   The patient was then awakened, extubated, and taken to the recovery room in stable condition.  BLOOD LOSS:  300cc DRAINS: 1 hemovac, 1 pain catheter COMPLICATIONS:  None.  PLAN OF CARE: Admit to inpatient   PATIENT DISPOSITION:  PACU - hemodynamically stable.   Delay start of Pharmacological VTE agent  (>24hrs) due to surgical blood loss or risk of bleeding:  not applicable  Please fax a copy of this op note to my office at [phone] (please only include page 1 and 2 of the Case Information op note)

## 2015-02-04 NOTE — Anesthesia Postprocedure Evaluation (Signed)
  Anesthesia Post-op Note  Patient: Hunter Crane  Procedure(s) Performed: Procedure(s): LEFT TOTAL KNEE ARTHROPLASTY (Left)  Patient Location: PACU  Anesthesia Type:GA combined with regional for post-op pain  Level of Consciousness: awake, alert , oriented and patient cooperative  Airway and Oxygen Therapy: Patient Spontanous Breathing  Post-op Pain: mild, moderate  Post-op Assessment: Post-op Vital signs reviewed, Patient's Cardiovascular Status Stable, Respiratory Function Stable, Patent Airway, No signs of Nausea or vomiting and Pain level controlled  Post-op Vital Signs: stable  Last Vitals:  Filed Vitals:   02/04/15 1233  BP: 119/81  Pulse: 94  Temp: 36.7 C  Resp: 20    Complications: No apparent anesthesia complications

## 2015-02-04 NOTE — Progress Notes (Signed)
Orthopedic Tech Progress Note Patient Details:  Hunter Crane 18-Sep-1951 [id number]  CPM Left Knee CPM Left Knee: On Left Knee Flexion (Degrees): 90 Left Knee Extension (Degrees): 0 Additional Comments: trapeze bar patient helper Viewed order from doctor's order list  [name] 02/04/2015, 11:25 AM

## 2015-02-04 NOTE — H&P (Signed)
Hunter Crane MRN:  [id number] DOB/SEX:  December 01, 1950/male  CHIEF COMPLAINT:  Painful left Knee  HISTORY: Patient is a 64 y.o. male presented with a history of pain in the left knee. Onset of symptoms was gradual starting several years ago with gradually worsening course since that time. Prior procedures on the knee include none. Patient has been treated conservatively with over-the-counter NSAIDs and activity modification. Patient currently rates pain in the knee at 9 out of 10 with activity. There is pain at night.  PAST MEDICAL HISTORY: There are no active problems to display for this patient.  Past Medical History  Diagnosis Date  . Hyperlipidemia     takes Atorvastatin daily  . Hypertension     takes Amlodipine daily  . Pneumonia 2012    hx of  . History of bronchitis     many yrs ago  . Arthritis   . Joint pain   . GERD (gastroesophageal reflux disease)     takes Protonix daily  . Cataract     left eye  . Glaucoma     uses eye drops daily  . Anxiety     takes Xanax nightly  . Macular degeneration     dry   Past Surgical History  Procedure Laterality Date  . Nasal sinus surgery  1990  . Hemorroidectomy  1994/1995  . Retinal detachment surgery Left     x 2  . Esophagogastroduodenoscopy    . Colonoscopy    . Cataract surgery Right      MEDICATIONS:   Prescriptions prior to admission  Medication Sig Dispense Refill Last Dose  . ALPRAZolam (XANAX) 0.5 MG tablet Take 0.5 mg by mouth at bedtime.   02/03/2015 at Unknown time  . amLODipine (NORVASC) 5 MG tablet Take 5 mg by mouth daily.   02/04/2015 at 0500  . aspirin EC 81 MG tablet Take 81 mg by mouth at bedtime.   Past Month at Unknown time  . atorvastatin (LIPITOR) 20 MG tablet Take 10 mg by mouth daily.   02/03/2015 at Unknown time  . Cholecalciferol (VITAMIN D) 2000 UNITS tablet Take 2,000 Units by mouth daily.   02/03/2015 at Unknown time  . latanoprost (XALATAN) 0.005 % ophthalmic solution Place 1 drop into both  eyes at bedtime.   02/03/2015 at Unknown time  . Omega-3 Fatty Acids (FISH OIL) 1200 MG CAPS Take 1,200 mg by mouth at bedtime.   Past Month at Unknown time  . pantoprazole (PROTONIX) 40 MG tablet Take 40 mg by mouth daily.   02/04/2015 at 0500  . pravastatin (PRAVACHOL) 20 MG tablet Take 20 mg by mouth at bedtime.   02/03/2015 at Unknown time  . vitamin C (ASCORBIC ACID) 500 MG tablet Take 1,000 mg by mouth daily.   02/03/2015 at Unknown time    ALLERGIES:  No Known Allergies  REVIEW OF SYSTEMS:  A comprehensive review of systems was negative.   FAMILY HISTORY:  History reviewed. No pertinent family history.  SOCIAL HISTORY:   History  Substance Use Topics  . Smoking status: Never Smoker   . Smokeless tobacco: Not on file     Comment: quit smoking > 6yrs ago  . Alcohol Use: Yes     Comment: scotch on weekends     EXAMINATION:  Vital signs in last 24 hours: Temp:  [97.6 F (36.4 C)] 97.6 F (36.4 C) (05/09 0704) Pulse Rate:  [80] 80 (05/09 0704) Resp:  [20] 20 (05/09 0704) BP: (159)/(91) 159/91 mmHg (05/09  5697) SpO2:  [97 %] 97 % (05/09 0704) Weight:  [85.276 kg (188 lb)] 85.276 kg (188 lb) (05/09 0704)  General appearance: alert, cooperative and no distress Lungs: clear to auscultation bilaterally Heart: regular rate and rhythm, S1, S2 normal, no murmur, click, rub or gallop Abdomen: soft, non-tender; bowel sounds normal; no masses,  no organomegaly Extremities: extremities normal, atraumatic, no cyanosis or edema and Homans sign is negative, no sign of DVT Pulses: 2+ and symmetric Skin: Skin color, texture, turgor normal. No rashes or lesions Neurologic: Alert and oriented X 3, normal strength and tone. Normal symmetric reflexes. Normal coordination and gait  Musculoskeletal:  ROM 0-110, Ligaments intact,  Imaging Review Plain radiographs demonstrate severe degenerative joint disease of the left knee. The overall alignment is significant varus. The bone quality appears to  be good for age and reported activity level.  Assessment/Plan: Primary osteoarthritis, left knee   The patient history, physical examination and imaging studies are consistent with advanced degenerative joint disease of the left knee. The patient has failed conservative treatment.  The clearance notes were reviewed.  After discussion with the patient it was felt that Total Knee Replacement was indicated. The procedure,  risks, and benefits of total knee arthroplasty were presented and reviewed. The risks including but not limited to aseptic loosening, infection, blood clots, vascular injury, stiffness, patella tracking problems complications among others were discussed. The patient acknowledged the explanation, agreed to proceed with the plan.  Cielle Aguila 02/04/2015, 7:07 AM

## 2015-02-04 NOTE — Evaluation (Signed)
Physical Therapy Evaluation Patient Details Name: Hunter Crane MRN: [id number] DOB: 11/25/1950 Today's Date: 02/04/2015   History of Present Illness  Pt admitted for L TKA.  Clinical Impression  Pt is s/p TKA resulting in the deficits listed below (see PT Problem List). Pt motivated and tolerated OOB mobility well this date with minimal pain and dizziness. Pt will benefit from skilled PT to increase their independence and safety with mobility to allow discharge to the venue listed below.      Follow Up Recommendations Home health PT;Supervision/Assistance - 24 hour    Equipment Recommendations  None recommended by PT    Recommendations for Other Services       Precautions / Restrictions Precautions Precautions: Knee Precaution Booklet Issued: Yes (comment) Precaution Comments: pt with verbal understanding Restrictions Weight Bearing Restrictions: Yes LLE Weight Bearing: Weight bearing as tolerated      Mobility  Bed Mobility Overal bed mobility: Needs Assistance Bed Mobility: Supine to Sit     Supine to sit: Supervision     General bed mobility comments: v/cs for long sit technique but pt able to manage wihtout using bed rail and was able to manage LEs on own  Transfers Overall transfer level: Needs assistance Equipment used: Rolling walker (2 wheeled) Transfers: Sit to/from Stand Sit to Stand: Min guard         General transfer comment: v/c's for safe hadn placement and L LE management  Ambulation/Gait Ambulation/Gait assistance: Min guard Ambulation Distance (Feet): 20 Feet Assistive device: Rolling walker (2 wheeled) Gait Pattern/deviations: Step-to pattern;Decreased step length - left;Decreased stance time - left Gait velocity: decrased   General Gait Details: v/c's for sequencing and maintaining L knee in quad set during stande phase  Stairs            Wheelchair Mobility    Modified Rankin (Stroke Patients Only)       Balance  Overall balance assessment: Needs assistance Sitting-balance support: Feet supported;No upper extremity supported Sitting balance-Leahy Scale: Good     Standing balance support: Bilateral upper extremity supported Standing balance-Leahy Scale: Poor Standing balance comment: needs RW                             Pertinent Vitals/Pain Pain Assessment: 0-10 Pain Score: 3  Pain Location: L knee Pain Descriptors / Indicators: Aching;Constant    Home Living Family/patient expects to be discharged to:: Private residence Living Arrangements: Spouse/significant other;Children Available Help at Discharge: Family;Available 24 hours/day Type of Home: Apartment Home Access: Stairs to enter Entrance Stairs-Rails: Left Entrance Stairs-Number of Steps: 15 Home Layout: One level Home Equipment: Walker - 2 wheels;Bedside commode Additional Comments: pt lives in [place] but is staying with son    Prior Function Level of Independence: Independent               Hand Dominance   Dominant Hand: Right    Extremity/Trunk Assessment   Upper Extremity Assessment: Overall WFL for tasks assessed           Lower Extremity Assessment: LLE deficits/detail   LLE Deficits / Details: able to initiate quad set and knee/hip flexion  Cervical / Trunk Assessment: Normal  Communication   Communication: No difficulties  Cognition Arousal/Alertness: Awake/alert Behavior During Therapy: WFL for tasks assessed/performed Overall Cognitive Status: Within Functional Limits for tasks assessed  General Comments      Exercises Total Joint Exercises Ankle Circles/Pumps: AROM;10 reps Quad Sets: AROM;Left;10 reps Heel Slides: AROM;Left;10 reps      Assessment/Plan    PT Assessment Patient needs continued PT services  PT Diagnosis Difficulty walking;Acute pain   PT Problem List Decreased strength;Decreased range of motion;Decreased activity  tolerance;Decreased balance;Decreased mobility  PT Treatment Interventions DME instruction;Gait training;Stair training;Functional mobility training;Therapeutic activities;Therapeutic exercise   PT Goals (Current goals can be found in the Care Plan section) Acute Rehab PT Goals PT Goal Formulation: With patient Time For Goal Achievement: 02/11/15 Potential to Achieve Goals: Good    Frequency 7X/week   Barriers to discharge        Co-evaluation               End of Session Equipment Utilized During Treatment: Gait belt Activity Tolerance: Patient tolerated treatment well Patient left: in chair;with call bell/phone within reach;with family/visitor present Nurse Communication: Mobility status         Time: 2595-6387 PT Time Calculation (min) (ACUTE ONLY): 36 min   Charges:   PT Evaluation $Initial PT Evaluation Tier I: 1 Procedure PT Treatments $Gait Training: 8-22 mins   PT G CodesKingsley Callander 02/04/2015, 4:23 PM   Kittie Plater, PT, DPT Pager #: [phone] Office #: [phone]

## 2015-02-04 NOTE — Anesthesia Procedure Notes (Addendum)
Anesthesia Regional Block:  Adductor canal block  Pre-Anesthetic Checklist: ,, timeout performed, Correct Patient, Correct Site, Correct Laterality, Correct Procedure, Correct Position, site marked, Risks and benefits discussed,  Surgical consent,  Pre-op evaluation,  At surgeon's request and post-op pain management  Laterality: Left  Prep: chloraprep and alcohol swabs       Needles:  Injection technique: Single-shot  Needle Type: Stimulator Needle - 80          Additional Needles:  Procedures: Doppler guided Adductor canal block Narrative:  Start time: 02/04/2015 8:00 AM End time: 02/04/2015 8:10 AM Injection made incrementally with aspirations every 5 mL.  Performed by: Personally  Anesthesiologist: Kate Sable  Additional Notes: Pt accepts procedure w/ risks. 35cc 0.5% Marcaine w/ epi w/o difficulty or discomfort. GES   Procedure Name: Intubation Date/Time: 02/04/2015 9:02 AM Performed by: Suzy Bouchard Pre-anesthesia Checklist: Patient identified, Emergency Drugs available, Suction available, Patient being monitored and Timeout performed Patient Re-evaluated:Patient Re-evaluated prior to inductionOxygen Delivery Method: Circle system utilized Preoxygenation: Pre-oxygenation with 100% oxygen Intubation Type: IV induction Ventilation: Mask ventilation without difficulty Laryngoscope Size: Miller and 2 Grade View: Grade I Tube type: Oral Tube size: 7.5 mm Number of attempts: 1 Airway Equipment and Method: Stylet and LTA kit utilized Placement Confirmation: ETT inserted through vocal cords under direct vision,  positive ETCO2 and breath sounds checked- equal and bilateral Secured at: 22 cm Tube secured with: Tape Dental Injury: Teeth and Oropharynx as per pre-operative assessment

## 2015-02-05 ENCOUNTER — Encounter (HOSPITAL_COMMUNITY): Payer: Self-pay | Admitting: Orthopedic Surgery

## 2015-02-05 LAB — CBC
HCT: 34.8 % — ABNORMAL LOW (ref 39.0–52.0)
HEMOGLOBIN: 11.5 g/dL — AB (ref 13.0–17.0)
MCH: 30.5 pg (ref 26.0–34.0)
MCHC: 33 g/dL (ref 30.0–36.0)
MCV: 92.3 fL (ref 78.0–100.0)
PLATELETS: 225 10*3/uL (ref 150–400)
RBC: 3.77 MIL/uL — AB (ref 4.22–5.81)
RDW: 12.7 % (ref 11.5–15.5)
WBC: 7.6 10*3/uL (ref 4.0–10.5)

## 2015-02-05 LAB — BASIC METABOLIC PANEL
Anion gap: 9 (ref 5–15)
BUN: 6 mg/dL (ref 6–20)
CHLORIDE: 96 mmol/L — AB (ref 101–111)
CO2: 25 mmol/L (ref 22–32)
Calcium: 8 mg/dL — ABNORMAL LOW (ref 8.9–10.3)
Creatinine, Ser: 0.96 mg/dL (ref 0.61–1.24)
GFR calc Af Amer: >60 mL/min (ref >60)
GFR calc non Af Amer: >60 mL/min (ref >60)
GLUCOSE: 108 mg/dL — AB (ref 70–99)
POTASSIUM: 3.6 mmol/L (ref 3.5–5.1)
SODIUM: 130 mmol/L — AB (ref 135–145)

## 2015-02-05 MED ORDER — METHOCARBAMOL 500 MG PO TABS: 500.0000 mg | ORAL_TABLET | Freq: Four times a day (QID) | ORAL | Status: AC | PRN

## 2015-02-05 MED ORDER — ENOXAPARIN SODIUM 40 MG/0.4ML ~~LOC~~ SOLN: 40.0000 mg | SUBCUTANEOUS | Status: AC

## 2015-02-05 MED ORDER — OXYCODONE HCL ER 10 MG PO T12A: 10.0000 mg | EXTENDED_RELEASE_TABLET | Freq: Two times a day (BID) | ORAL | Status: AC

## 2015-02-05 MED ORDER — OXYCODONE HCL 5 MG PO TABS: 5.0000 mg | ORAL_TABLET | ORAL | Status: AC | PRN

## 2015-02-05 NOTE — Progress Notes (Signed)
Physical Therapy Treatment Patient Details Name: Yer Olivencia MRN: [id number] DOB: 1951-02-16 Today's Date: 02/05/2015    History of Present Illness Pt admitted for L TKA.    PT Comments    Patient somewhat limited due to not sleeping well and feeling very tired. Still able to ambulate well and complete therex. Planning to DC home this afternoon. Will practice steps prior to DC   Follow Up Recommendations  Home health PT;Supervision/Assistance - 24 hour     Equipment Recommendations  None recommended by PT    Recommendations for Other Services       Precautions / Restrictions Precautions Precautions: Knee Precaution Comments: reviewed no pillow under knee Restrictions Weight Bearing Restrictions: Yes LLE Weight Bearing: Weight bearing as tolerated    Mobility  Bed Mobility               General bed mobility comments: UP in recliner after OT   Transfers Overall transfer level: Needs assistance Equipment used: Rolling walker (2 wheeled) Transfers: Sit to/from Stand Sit to Stand: Supervision         General transfer comment: Cues for safety, hand placement, and LLE management   Ambulation/Gait Ambulation/Gait assistance: Supervision Ambulation Distance (Feet): 90 Feet Assistive device: Rolling walker (2 wheeled) Gait Pattern/deviations: Step-through pattern;Decreased stride length Gait velocity: guarded Gait velocity interpretation: Below normal speed for age/gender General Gait Details: Good safe use of RW. No buckling noted   Stairs            Wheelchair Mobility    Modified Rankin (Stroke Patients Only)       Balance Overall balance assessment: Needs assistance Sitting-balance support: No upper extremity supported;Feet supported Sitting balance-Leahy Scale: Good     Standing balance support: Bilateral upper extremity supported;During functional activity Standing balance-Leahy Scale: Fair (using RW)                      Cognition Arousal/Alertness: Awake/alert Behavior During Therapy: WFL for tasks assessed/performed Overall Cognitive Status: Within Functional Limits for tasks assessed                      Exercises Total Joint Exercises Quad Sets: AROM;Left;10 reps Heel Slides: AROM;Left;10 reps Hip ABduction/ADduction: AROM;Left;10 reps Straight Leg Raises: AAROM;Left;10 reps    General Comments        Pertinent Vitals/Pain Pain Assessment: 0-10 Pain Score: 5  Pain Location: L  quad Pain Descriptors / Indicators: Aching Pain Intervention(s): Monitored during session    Home Living Family/patient expects to be discharged to:: Private residence Living Arrangements: Spouse/significant other;Children Available Help at Discharge: Family;Available 24 hours/day Type of Home: Apartment Home Access: Stairs to enter Entrance Stairs-Rails: Left Home Layout: One level Home Equipment: Walker - 2 wheels;Bedside commode      Prior Function Level of Independence: Independent          PT Goals (current goals can now be found in the care plan section) Acute Rehab PT Goals Patient Stated Goal: get some sleep Progress towards PT goals: Progressing toward goals    Frequency  7X/week    PT Plan Current plan remains appropriate    Co-evaluation             End of Session Equipment Utilized During Treatment: Gait belt Activity Tolerance: Patient tolerated treatment well Patient left: in chair;with call bell/phone within reach     Time: 0842-0906 PT Time Calculation (min) (ACUTE ONLY): 24 min  Charges:  $Gait Training: 8-22 mins $  Therapeutic Exercise: 8-22 mins                    G Codes:      Jacqualyn Posey 02/05/2015, 9:10 AM  02/05/2015 Jacqualyn Posey PTA [phone] pager [phone] office

## 2015-02-05 NOTE — Progress Notes (Signed)
Physical Therapy Treatment Patient Details Name: Hunter Crane MRN: [id number] DOB: September 03, 1951 Today's Date: 02/05/2015    History of Present Illness Pt admitted for L TKA.    PT Comments    Patient able to complete stair training this afternoon. Patient safe to D/C from a mobility standpoint based on progression towards goals set on PT eval.    Follow Up Recommendations  Home health PT;Supervision/Assistance - 24 hour     Equipment Recommendations  None recommended by PT    Recommendations for Other Services       Precautions / Restrictions Precautions Precautions: Knee Precaution Comments: reviewed no pillow under knee Restrictions Weight Bearing Restrictions: Yes LLE Weight Bearing: Weight bearing as tolerated    Mobility  Bed Mobility         Supine to sit: Min assist     General bed mobility comments: Min A for LLE  Transfers Overall transfer level: Needs assistance     Sit to Stand: Supervision         General transfer comment: Cues for safety, hand placement, and LLE management   Ambulation/Gait Ambulation/Gait assistance: Supervision Ambulation Distance (Feet): 250 Feet       Gait velocity interpretation: Below normal speed for age/gender General Gait Details: Good safe use of RW. No buckling noted   Stairs Stairs: Yes Stairs assistance: Min guard Stair Management: Step to pattern;One rail Left Number of Stairs: 3 General stair comments: Cues for sequency and technique  Wheelchair Mobility    Modified Rankin (Stroke Patients Only)       Balance                                    Cognition Arousal/Alertness: Awake/alert Behavior During Therapy: WFL for tasks assessed/performed Overall Cognitive Status: Within Functional Limits for tasks assessed                      Exercises      General Comments        Pertinent Vitals/Pain Pain Score: 6  Pain Location: L quad Pain Descriptors /  Indicators: Aching Pain Intervention(s): Monitored during session    Home Living                      Prior Function            PT Goals (current goals can now be found in the care plan section) Progress towards PT goals: Progressing toward goals    Frequency  7X/week    PT Plan Current plan remains appropriate    Co-evaluation             End of Session Equipment Utilized During Treatment: Gait belt Activity Tolerance: Patient tolerated treatment well Patient left: in bed;with call bell/phone within reach     Time: 9563-8756 PT Time Calculation (min) (ACUTE ONLY): 18 min  Charges:  $Gait Training: 8-22 mins                    G Codes:      Jacqualyn Posey 02/05/2015, 2:28 PM 02/05/2015 Jacqualyn Posey PTA [phone] pager [phone] office

## 2015-02-05 NOTE — Progress Notes (Signed)
Occupational Therapy Evaluation Patient Details Name: Hunter Crane MRN: [id number] DOB: Aug 29, 1951 Today's Date: 02/05/2015    History of Present Illness Pt admitted for L TKA.   Clinical Impression   Patient independent PTA. Patient currently functioning at an overall min guard>min assist level. Patient will benefit from acute OT to increase overall independence in the areas of ADLs, functional mobility, and overall safety in order to safely discharge home with 24/7 supervision/assistance from wife.     Follow Up Recommendations  No OT follow up;Supervision/Assistance - 24 hour    Equipment Recommendations  None recommended by OT    Recommendations for Other Services  None at this time   Precautions / Restrictions Precautions Precautions: Knee Precaution Comments: reviewed no pillow under knee Restrictions Weight Bearing Restrictions: Yes LLE Weight Bearing: Weight bearing as tolerated      Mobility Bed Mobility General bed mobility comments: Patient found seated EOB upon OT entering room  Transfers Overall transfer level: Needs assistance Equipment used: Rolling walker (2 wheeled) Transfers: Sit to/from Stand Sit to Stand: Min guard General transfer comment: Cues for safety, hand placement, and LLE management     Balance Overall balance assessment: Needs assistance Sitting-balance support: No upper extremity supported;Feet supported Sitting balance-Leahy Scale: Good     Standing balance support: Bilateral upper extremity supported;During functional activity Standing balance-Leahy Scale: Fair (using RW)     ADL Overall ADL's : Needs assistance/impaired General ADL Comments: Patient overall min guard>min assist with ADLs and functional mobility. With extra time, patient able to don/doff socks in seated position. Patient with some LOB while using RW, decreased safety awarness using RW. Patient stated he didn't sleep well last night and that he didn't eat well  yesterday. Encouraged patient to stay seated up for breakfast and to work with PT.  Patient left with zero knee donned.     Pertinent Vitals/Pain Pain Assessment: 0-10 Pain Score: 5  Pain Location: L knee/quad Pain Descriptors / Indicators: Aching Pain Intervention(s): Limited activity within patient's tolerance;Monitored during session;Repositioned     Hand Dominance Right   Extremity/Trunk Assessment Upper Extremity Assessment Upper Extremity Assessment: Overall WFL for tasks assessed   Lower Extremity Assessment Lower Extremity Assessment: Defer to PT evaluation   Cervical / Trunk Assessment Cervical / Trunk Assessment: Normal   Communication Communication Communication: No difficulties   Cognition Arousal/Alertness: Awake/alert Behavior During Therapy: WFL for tasks assessed/performed Overall Cognitive Status: Within Functional Limits for tasks assessed (pt with decreased safety awareness and decreased awareness into deficits. When therapist encouraged use of RW due to LOB and decreased activity tolerance/endurance, pt stated "my right leg is fine!". )              Home Living Family/patient expects to be discharged to:: Private residence Living Arrangements: Spouse/significant other;Children Available Help at Discharge: Family;Available 24 hours/day Type of Home: Apartment Home Access: Stairs to enter Entrance Stairs-Number of Steps: 15 Entrance Stairs-Rails: Left Home Layout: One level     Bathroom Shower/Tub: Tub/shower unit;Curtain   Biochemist, clinical: Standard     Home Equipment: Environmental consultant - 2 wheels;Bedside commode   Prior Functioning/Environment Level of Independence: Independent      OT Diagnosis: Generalized weakness;Acute pain   OT Problem List: Decreased strength;Decreased range of motion;Decreased activity tolerance;Impaired balance (sitting and/or standing);Decreased safety awareness;Decreased knowledge of use of DME or AE;Decreased knowledge of  precautions;Pain   OT Treatment/Interventions: Self-care/ADL training;Therapeutic exercise;Energy conservation;DME and/or AE instruction;Therapeutic activities;Patient/family education;Balance training    OT Goals(Current goals can be found  in the care plan section) Acute Rehab OT Goals Patient Stated Goal: get some sleep OT Goal Formulation: With patient Time For Goal Achievement: 02/12/15 Potential to Achieve Goals: Good ADL Goals Pt Will Perform Grooming: with modified independence;standing Pt Will Perform Lower Body Bathing: with modified independence;sit to/from stand Pt Will Perform Lower Body Dressing: with modified independence;sit to/from stand Pt Will Transfer to Toilet: with modified independence;ambulating;bedside commode Pt Will Perform Tub/Shower Transfer: Tub transfer;with supervision;3 in 1;ambulating;rolling walker  OT Frequency: Min 2X/week   Barriers to D/C: None known at this time    End of Session Equipment Utilized During Treatment: Rolling walker CPM Left Knee CPM Left Knee: Off  Activity Tolerance: Patient limited by fatigue (Pt reports he didn't sleep well last night nor eat much yesterday) Patient left: in chair;with call bell/phone within reach   Time: 1660-6004 OT Time Calculation (min): 24 min  Charges:  OT General Charges $OT Visit: 1 Procedure OT Evaluation $Initial OT Evaluation Tier I: 1 Procedure OT Treatments $Self Care/Home Management : 8-22 mins  Demetris Meinhardt , MS, OTR/L, CLT Pager: [phone]  02/05/2015, 8:54 AM

## 2015-02-05 NOTE — Progress Notes (Signed)
Orthopedic Tech Progress Note Patient Details:  Hunter Crane 09-Apr-1951 [id number]  Patient ID: [name], male   DOB: 10-17-1950, 64 y.o.   MRN: [id number] Placed pt's lle in cpm @ 0-50 degrees @1405 ; will increase as pt tolerates; RN notified  [name] 02/05/2015, 1:58 PM

## 2015-02-05 NOTE — Progress Notes (Signed)
Hunter Crane to be D/C'd Home per MD order. Discussed with the patient and all questions fully answered.    Medication List    STOP taking these medications        aspirin EC 81 MG tablet     Fish Oil 1200 MG Caps      TAKE these medications        ALPRAZolam 0.5 MG tablet  Commonly known as:  XANAX  Take 0.5 mg by mouth at bedtime.     amLODipine 5 MG tablet  Commonly known as:  NORVASC  Take 5 mg by mouth daily.     enoxaparin 40 MG/0.4ML injection  Commonly known as:  LOVENOX  Inject 0.4 mLs (40 mg total) into the skin daily.     latanoprost 0.005 % ophthalmic solution  Commonly known as:  XALATAN  Place 1 drop into both eyes at bedtime.     methocarbamol 500 MG tablet  Commonly known as:  ROBAXIN  Take 1-2 tablets (500-1,000 mg total) by mouth every 6 (six) hours as needed for muscle spasms.     oxyCODONE 5 MG immediate release tablet  Commonly known as:  Oxy IR/ROXICODONE  Take 1-2 tablets (5-10 mg total) by mouth every 4 (four) hours as needed for breakthrough pain.     OxyCODONE 10 mg T12a 12 hr tablet  Commonly known as:  OXYCONTIN  Take 1 tablet (10 mg total) by mouth every 12 (twelve) hours.     pantoprazole 40 MG tablet  Commonly known as:  PROTONIX  Take 40 mg by mouth daily.     pravastatin 20 MG tablet  Commonly known as:  PRAVACHOL  Take 20 mg by mouth at bedtime.     vitamin C 500 MG tablet  Commonly known as:  ASCORBIC ACID  Take 1,000 mg by mouth daily.     Vitamin D 2000 UNITS tablet  Take 2,000 Units by mouth daily.        VVS, Skin clean, dry and intact without evidence of skin break down, no evidence of skin tears noted.  IV catheter discontinued intact. Site without signs and symptoms of complications. Dressing and pressure applied.  An After Visit Summary was printed and given to the patient.  Patient escorted via [place], and D/C home via private auto.  Cyndra Numbers  02/05/2015 4:15 PM

## 2015-02-05 NOTE — Discharge Instructions (Signed)
INSTRUCTIONS AFTER JOINT REPLACEMENT   o Remove items at home which could result in a fall. This includes throw rugs or furniture in walking pathways o ICE to the affected joint every three hours while awake for 30 minutes at a time, for at least the first 3-5 days, and then as needed for pain and swelling.  Continue to use ice for pain and swelling. You may notice swelling that will progress down to the foot and ankle.  This is normal after surgery.  Elevate your leg when you are not up walking on it.   o Continue to use the breathing machine you got in the hospital (incentive spirometer) which will help keep your temperature down.  It is common for your temperature to cycle up and down following surgery, especially at night when you are not up moving around and exerting yourself.  The breathing machine keeps your lungs expanded and your temperature down.   DIET:  As you were doing prior to hospitalization, we recommend a well-balanced diet.  DRESSING / WOUND CARE / SHOWERING  May shower wednesday  ACTIVITY  o Increase activity slowly as tolerated, but follow the weight bearing instructions below.   o No driving for 6 weeks or until further direction given by your physician.  You cannot drive while taking narcotics.  o No lifting or carrying greater than 10 lbs. until further directed by your surgeon. o Avoid periods of inactivity such as sitting longer than an hour when not asleep. This helps prevent blood clots.  o You may return to work once you are authorized by your doctor.     WEIGHT BEARING   Weight bearing as tolerated with assist device (walker, cane, etc) as directed, use it as long as suggested by your surgeon or therapist, typically at least 4-6 weeks.   EXERCISES  Results after joint replacement surgery are often greatly improved when you follow the exercise, range of motion and muscle strengthening exercises prescribed by your doctor. Safety measures are also important to  protect the joint from further injury. Any time any of these exercises cause you to have increased pain or swelling, decrease what you are doing until you are comfortable again and then slowly increase them. If you have problems or questions, call your caregiver or physical therapist for advice.   Rehabilitation is important following a joint replacement. After just a few days of immobilization, the muscles of the leg can become weakened and shrink (atrophy).  These exercises are designed to build up the tone and strength of the thigh and leg muscles and to improve motion. Often times heat used for twenty to thirty minutes before working out will loosen up your tissues and help with improving the range of motion but do not use heat for the first two weeks following surgery (sometimes heat can increase post-operative swelling).   These exercises can be done on a training (exercise) mat, on the floor, on a table or on a bed. Use whatever works the best and is most comfortable for you.    Use music or television while you are exercising so that the exercises are a pleasant break in your day. This will make your life better with the exercises acting as a break in your routine that you can look forward to.   Perform all exercises about fifteen times, three times per day or as directed.  You should exercise both the operative leg and the other leg as well.  Exercises include:  Quad Sets - Tighten up the muscle on the front of the thigh (Quad) and hold for 5-10 seconds.    Straight Leg Raises - With your knee straight (if you were given a brace, keep it on), lift the leg to 60 degrees, hold for 3 seconds, and slowly lower the leg.  Perform this exercise against resistance later as your leg gets stronger.   Leg Slides: Lying on your back, slowly slide your foot toward your buttocks, bending your knee up off the floor (only go as far as is comfortable). Then slowly slide your foot back down until your leg is  flat on the floor again.   Angel Wings: Lying on your back spread your legs to the side as far apart as you can without causing discomfort.   Hamstring Strength:  Lying on your back, push your heel against the floor with your leg straight by tightening up the muscles of your buttocks.  Repeat, but this time bend your knee to a comfortable angle, and push your heel against the floor.  You may put a pillow under the heel to make it more comfortable if necessary.   A rehabilitation program following joint replacement surgery can speed recovery and prevent re-injury in the future due to weakened muscles. Contact your doctor or a physical therapist for more information on knee rehabilitation.    CONSTIPATION  Constipation is defined medically as fewer than three stools per week and severe constipation as less than one stool per week.  Even if you have a regular bowel pattern at home, your normal regimen is likely to be disrupted due to multiple reasons following surgery.  Combination of anesthesia, postoperative narcotics, change in appetite and fluid intake all can affect your bowels.   YOU MUST use at least one of the following options; they are listed in order of increasing strength to get the job done.  They are all available over the counter, and you may need to use some, POSSIBLY even all of these options:    Drink plenty of fluids (prune juice may be helpful) and high fiber foods Colace 100 mg by mouth twice a day  Senokot for constipation as directed and as needed Dulcolax (bisacodyl), take with full glass of water  Miralax (polyethylene glycol) once or twice a day as needed.  If you have tried all these things and are unable to have a bowel movement in the first 3-4 days after surgery call either your surgeon or your primary doctor.    If you experience loose stools or diarrhea, hold the medications until you stool forms back up.  If your symptoms do not get better within 1 week or if they  get worse, check with your doctor.  If you experience "the worst abdominal pain ever" or develop nausea or vomiting, please contact the office immediately for further recommendations for treatment.   ITCHING:  If you experience itching with your medications, try taking only a single pain pill, or even half a pain pill at a time.  You can also use Benadryl over the counter for itching or also to help with sleep.   TED HOSE STOCKINGS:  Use stockings on both legs until for at least 2 weeks or as directed by physician office. They may be removed at night for sleeping.  MEDICATIONS:  See your medication summary on the After Visit Summary that nursing will review with you.  You may have some home medications which will be placed on hold until  you complete the course of blood thinner medication.  It is important for you to complete the blood thinner medication as prescribed. ° °PRECAUTIONS:  If you experience chest pain or shortness of breath - call 911 immediately for transfer to the hospital emergency department.  ° °If you develop a fever greater that 101 F, purulent drainage from wound, increased redness or drainage from wound, foul odor from the wound/dressing, or calf pain - CONTACT YOUR SURGEON.   °                                                °FOLLOW-UP APPOINTMENTS:  If you do not already have a post-op appointment, please call the office for an appointment to be seen by your surgeon.  Guidelines for how soon to be seen are listed in your “After Visit Summary”, but are typically between 1-4 weeks after surgery. ° °OTHER INSTRUCTIONS:  ° °Knee Replacement:  Do not place pillow under knee, focus on keeping the knee straight while resting. CPM instructions: 0-90 degrees, 2 hours in the morning, 2 hours in the afternoon, and 2 hours in the evening. Place foam block, curve side up under heel at all times except when in CPM or when walking.  DO NOT modify, tear, cut, or change the foam block in any  way. ° °MAKE SURE YOU:  °• Understand these instructions.  °• Get help right away if you are not doing well or get worse.  ° ° °Thank you for letting us be a part of your medical care team.  It is a privilege we respect greatly.  We hope these instructions will help you stay on track for a fast and full recovery!  ° °

## 2015-02-05 NOTE — Progress Notes (Signed)
SPORTS MEDICINE AND JOINT REPLACEMENT  [name], MD   Carlynn Spry, PA-C [place], [place], [place]  [address]                             [phone]   PROGRESS NOTE  Subjective:  negative for Chest Pain  negative for Shortness of Breath  negative for Nausea/Vomiting   negative for Calf Pain  negative for Bowel Movement   Tolerating Diet: yes         Patient reports pain as 5 on 0-10 scale.    Objective: Vital signs in last 24 hours:   Patient Vitals for the past 24 hrs:  BP Temp Temp src Pulse SpO2  02/05/15 0624 (!) 141/82 mmHg 97.8 F (36.6 C) Oral 90 100 %  02/05/15 0217 124/68 mmHg 97.7 F (36.5 C) Oral 82 99 %  02/04/15 1950 129/78 mmHg 97.6 F (36.4 C) Axillary 88 100 %    @flow {1959:LAST@   Intake/Output from previous day:   05/09 0701 - 05/10 0700 In: 3180 [P.O.:400; I.V.:2730] Out: 2950 [Urine:2950]   Intake/Output this shift:       Intake/Output      05/09 0701 - 05/10 0700 05/10 0701 - 05/11 0700   P.O. 400    I.V. (mL/kg) 2730 (32)    IV Piggyback 50    Total Intake(mL/kg) 3180 (37.3)    Urine (mL/kg/hr) 2950 (1.4)    Total Output 2950     Net +230          Urine Occurrence 1 x       LABORATORY DATA:  Recent Labs  02/04/15 1339 02/05/15 0611  WBC 10.6* 7.6  HGB 13.7 11.5*  HCT 42.2 34.8*  PLT 266 225    Recent Labs  02/04/15 1339 02/05/15 0611  NA  --  130*  K  --  3.6  CL  --  96*  CO2  --  25  BUN  --  6  CREATININE 0.93 0.96  GLUCOSE  --  108*  CALCIUM  --  8.0*   Lab Results  Component Value Date   INR 1.08 01/28/2015    Examination:  General appearance: alert, cooperative and no distress Extremities: extremities normal, atraumatic, no cyanosis or edema and Homans sign is negative, no sign of DVT  Wound Exam: clean, dry, intact   Drainage:  None: wound tissue dry  Motor Exam: EHL and FHL Intact  Sensory Exam: Deep Peroneal normal   Assessment:    1 Day Post-Op  Procedure(s)  (LRB): LEFT TOTAL KNEE ARTHROPLASTY (Left)  ADDITIONAL DIAGNOSIS:  Active Problems:   S/P total knee arthroplasty  Acute Blood Loss Anemia   Plan: Physical Therapy as ordered Weight Bearing as Tolerated (WBAT)  DVT Prophylaxis:  Lovenox  DISCHARGE PLAN: Home  DISCHARGE NEEDS: HHPT, CPM, Walker and 3-in-1 comode seat         Hunter Crane 02/05/2015, 2:09 PM

## 2015-02-06 DIAGNOSIS — H3530 Unspecified macular degeneration: Secondary | ICD-10-CM | POA: Diagnosis not present

## 2015-02-06 DIAGNOSIS — H409 Unspecified glaucoma: Secondary | ICD-10-CM | POA: Diagnosis not present

## 2015-02-06 DIAGNOSIS — Z96652 Presence of left artificial knee joint: Secondary | ICD-10-CM | POA: Diagnosis not present

## 2015-02-06 DIAGNOSIS — Z471 Aftercare following joint replacement surgery: Secondary | ICD-10-CM | POA: Diagnosis not present

## 2015-02-06 DIAGNOSIS — M1990 Unspecified osteoarthritis, unspecified site: Secondary | ICD-10-CM | POA: Diagnosis not present

## 2015-02-06 DIAGNOSIS — I10 Essential (primary) hypertension: Secondary | ICD-10-CM | POA: Diagnosis not present

## 2015-02-07 DIAGNOSIS — M1990 Unspecified osteoarthritis, unspecified site: Secondary | ICD-10-CM | POA: Diagnosis not present

## 2015-02-07 DIAGNOSIS — Z471 Aftercare following joint replacement surgery: Secondary | ICD-10-CM | POA: Diagnosis not present

## 2015-02-07 DIAGNOSIS — I10 Essential (primary) hypertension: Secondary | ICD-10-CM | POA: Diagnosis not present

## 2015-02-07 DIAGNOSIS — H3530 Unspecified macular degeneration: Secondary | ICD-10-CM | POA: Diagnosis not present

## 2015-02-07 DIAGNOSIS — H409 Unspecified glaucoma: Secondary | ICD-10-CM | POA: Diagnosis not present

## 2015-02-07 DIAGNOSIS — Z96652 Presence of left artificial knee joint: Secondary | ICD-10-CM | POA: Diagnosis not present

## 2015-02-08 DIAGNOSIS — M1990 Unspecified osteoarthritis, unspecified site: Secondary | ICD-10-CM | POA: Diagnosis not present

## 2015-02-08 DIAGNOSIS — H409 Unspecified glaucoma: Secondary | ICD-10-CM | POA: Diagnosis not present

## 2015-02-08 DIAGNOSIS — Z96652 Presence of left artificial knee joint: Secondary | ICD-10-CM | POA: Diagnosis not present

## 2015-02-08 DIAGNOSIS — Z471 Aftercare following joint replacement surgery: Secondary | ICD-10-CM | POA: Diagnosis not present

## 2015-02-08 DIAGNOSIS — I10 Essential (primary) hypertension: Secondary | ICD-10-CM | POA: Diagnosis not present

## 2015-02-08 DIAGNOSIS — H3530 Unspecified macular degeneration: Secondary | ICD-10-CM | POA: Diagnosis not present

## 2015-02-11 DIAGNOSIS — H3530 Unspecified macular degeneration: Secondary | ICD-10-CM | POA: Diagnosis not present

## 2015-02-11 DIAGNOSIS — I10 Essential (primary) hypertension: Secondary | ICD-10-CM | POA: Diagnosis not present

## 2015-02-11 DIAGNOSIS — Z471 Aftercare following joint replacement surgery: Secondary | ICD-10-CM | POA: Diagnosis not present

## 2015-02-11 DIAGNOSIS — H409 Unspecified glaucoma: Secondary | ICD-10-CM | POA: Diagnosis not present

## 2015-02-11 DIAGNOSIS — Z96652 Presence of left artificial knee joint: Secondary | ICD-10-CM | POA: Diagnosis not present

## 2015-02-11 DIAGNOSIS — M1990 Unspecified osteoarthritis, unspecified site: Secondary | ICD-10-CM | POA: Diagnosis not present

## 2015-02-11 NOTE — Discharge Summary (Signed)
SPORTS MEDICINE & JOINT REPLACEMENT   [name], MD   Carlynn Spry, PA-C [place], [place], [place]  [address]                             [phone]  PATIENT ID: Hunter Crane        MRN:  [id number]          DOB/AGE: Jun 06, 1951 / 64 y.o.    DISCHARGE SUMMARY  ADMISSION DATE:    02/04/2015 DISCHARGE DATE:  02/05/2015  ADMISSION DIAGNOSIS: Primary osteoarthritis left knee     DISCHARGE DIAGNOSIS:  primary left knee osteoarthritis     ADDITIONAL DIAGNOSIS: Active Problems:   S/P total knee arthroplasty  Past Medical History  Diagnosis Date  . Hyperlipidemia     takes Atorvastatin daily  . Hypertension     takes Amlodipine daily  . Pneumonia 2012    hx of  . History of bronchitis     many yrs ago  . Arthritis   . Joint pain   . GERD (gastroesophageal reflux disease)     takes Protonix daily  . Cataract     left eye  . Glaucoma     uses eye drops daily  . Anxiety     takes Xanax nightly  . Macular degeneration     dry    PROCEDURE: Procedure(s): LEFT TOTAL KNEE ARTHROPLASTY on 02/04/2015  CONSULTS:     HISTORY:  See H&P in chart  HOSPITAL COURSE:  Hunter Crane is a 64 y.o. admitted on 02/04/2015 and found to have a diagnosis of primary left knee osteoarthritis .  After appropriate laboratory studies were obtained  they were taken to the operating room on 02/04/2015 and underwent Procedure(s): LEFT TOTAL KNEE ARTHROPLASTY.   They were given perioperative antibiotics:  Anti-infectives    Start     Dose/Rate Route Frequency Ordered Stop   02/04/15 1500  ceFAZolin (ANCEF) IVPB 2 g/50 mL premix     2 g 100 mL/hr over 30 Minutes Intravenous Every 6 hours 02/04/15 1241 02/04/15 2113   02/04/15 0800  ceFAZolin (ANCEF) IVPB 2 g/50 mL premix     2 g 100 mL/hr over 30 Minutes Intravenous To Surgery 02/01/15 1411 02/04/15 0901    .  Tolerated the procedure well.  Placed with a foley intraoperatively.  Given Ofirmev at induction and for 48  hours.    POD# 1: Vital signs were stable.  Patient denied Chest pain, shortness of breath, or calf pain.  Patient was started on Lovenox 30 mg subcutaneously twice daily at 8am.  Consults to PT, OT, and care management were made.  The patient was weight bearing as tolerated.  CPM was placed on the operative leg 0-90 degrees for 6-8 hours a day.  Incentive spirometry was taught.  Dressing was changed.  Hemovac was discontinued.      Continued  PT for ambulation and exercise program.  IV saline locked.  O2 discontinued.    The remainder of the hospital course was dedicated to ambulation and strengthening.   The patient was discharged on 1 day post op in  Good condition.  Blood products given:none  DIAGNOSTIC STUDIES: Recent vital signs: No data found.      Recent laboratory studies:  Recent Labs  02/04/15 1339 02/05/15 0611  WBC 10.6* 7.6  HGB 13.7 11.5*  HCT 42.2 34.8*  PLT 266 225    Recent Labs  02/04/15  1339 02/05/15 0611  NA  --  130*  K  --  3.6  CL  --  96*  CO2  --  25  BUN  --  6  CREATININE 0.93 0.96  GLUCOSE  --  108*  CALCIUM  --  8.0*   Lab Results  Component Value Date   INR 1.08 01/28/2015     Recent Radiographic Studies :  Dg Chest 2 View  01/28/2015   CLINICAL DATA:  Preoperative evaluation for left knee replacement  EXAM: CHEST  2 VIEW  COMPARISON:  None.  FINDINGS: The heart size and mediastinal contours are within normal limits. Both lungs are clear. The visualized skeletal structures are unremarkable.  IMPRESSION: No active cardiopulmonary disease.   Electronically Signed   By: Inez Catalina M.D.   On: 01/28/2015 10:41    DISCHARGE INSTRUCTIONS: Discharge Instructions    CPM    Complete by:  As directed   Continuous passive motion machine (CPM):      Use the CPM from 0 to 90 for 6-8 hours per day.      You may increase by 10 per day.  You may break it up into 2 or 3 sessions per day.      Use CPM for 2 weeks or until you are told to stop.      Call MD / Call 911    Complete by:  As directed   If you experience chest pain or shortness of breath, CALL 911 and be transported to the hospital emergency room.  If you develope a fever above 101 F, pus (white drainage) or increased drainage or redness at the wound, or calf pain, call your surgeon's office.     Change dressing    Complete by:  As directed   Change dressing on Wednesday, then change the dressing daily with sterile 4 x 4 inch gauze dressing and apply TED hose.     Constipation Prevention    Complete by:  As directed   Drink plenty of fluids.  Prune juice may be helpful.  You may use a stool softener, such as Colace (over the counter) 100 mg twice a day.  Use MiraLax (over the counter) for constipation as needed.     Diet - low sodium heart healthy    Complete by:  As directed      Do not put a pillow under the knee. Place it under the heel.    Complete by:  As directed      Driving restrictions    Complete by:  As directed   No driving for 6 weeks     Increase activity slowly as tolerated    Complete by:  As directed      Lifting restrictions    Complete by:  As directed   No lifting for 6 weeks     TED hose    Complete by:  As directed   Use stockings (TED hose) for 2 weeks on both leg(s).  You may remove them at night for sleeping.           DISCHARGE MEDICATIONS:     Medication List    STOP taking these medications        aspirin EC 81 MG tablet     Fish Oil 1200 MG Caps      TAKE these medications        ALPRAZolam 0.5 MG tablet  Commonly known as:  XANAX  Take 0.5 mg by  mouth at bedtime.     amLODipine 5 MG tablet  Commonly known as:  NORVASC  Take 5 mg by mouth daily.     enoxaparin 40 MG/0.4ML injection  Commonly known as:  LOVENOX  Inject 0.4 mLs (40 mg total) into the skin daily.     latanoprost 0.005 % ophthalmic solution  Commonly known as:  XALATAN  Place 1 drop into both eyes at bedtime.     methocarbamol 500 MG tablet   Commonly known as:  ROBAXIN  Take 1-2 tablets (500-1,000 mg total) by mouth every 6 (six) hours as needed for muscle spasms.     oxyCODONE 5 MG immediate release tablet  Commonly known as:  Oxy IR/ROXICODONE  Take 1-2 tablets (5-10 mg total) by mouth every 4 (four) hours as needed for breakthrough pain.     OxyCODONE 10 mg T12a 12 hr tablet  Commonly known as:  OXYCONTIN  Take 1 tablet (10 mg total) by mouth every 12 (twelve) hours.     pantoprazole 40 MG tablet  Commonly known as:  PROTONIX  Take 40 mg by mouth daily.     pravastatin 20 MG tablet  Commonly known as:  PRAVACHOL  Take 20 mg by mouth at bedtime.     vitamin C 500 MG tablet  Commonly known as:  ASCORBIC ACID  Take 1,000 mg by mouth daily.     Vitamin D 2000 UNITS tablet  Take 2,000 Units by mouth daily.        FOLLOW UP VISIT:       Follow-up Information    Follow up with Rudean Haskell, MD. Call on 02/19/2015.   Specialty:  Orthopedic Surgery   Contact information:   [address] [place] [address] [phone]       DISPOSITION: HOME   CONDITION:  Good   Hunter Crane 02/11/2015, 9:44 AM

## 2015-02-13 DIAGNOSIS — I10 Essential (primary) hypertension: Secondary | ICD-10-CM | POA: Diagnosis not present

## 2015-02-13 DIAGNOSIS — H3530 Unspecified macular degeneration: Secondary | ICD-10-CM | POA: Diagnosis not present

## 2015-02-13 DIAGNOSIS — M1990 Unspecified osteoarthritis, unspecified site: Secondary | ICD-10-CM | POA: Diagnosis not present

## 2015-02-13 DIAGNOSIS — Z471 Aftercare following joint replacement surgery: Secondary | ICD-10-CM | POA: Diagnosis not present

## 2015-02-13 DIAGNOSIS — H409 Unspecified glaucoma: Secondary | ICD-10-CM | POA: Diagnosis not present

## 2015-02-13 DIAGNOSIS — Z96652 Presence of left artificial knee joint: Secondary | ICD-10-CM | POA: Diagnosis not present

## 2015-02-14 DIAGNOSIS — Z96652 Presence of left artificial knee joint: Secondary | ICD-10-CM | POA: Diagnosis not present

## 2015-02-14 DIAGNOSIS — Z471 Aftercare following joint replacement surgery: Secondary | ICD-10-CM | POA: Diagnosis not present

## 2015-02-14 DIAGNOSIS — H3530 Unspecified macular degeneration: Secondary | ICD-10-CM | POA: Diagnosis not present

## 2015-02-14 DIAGNOSIS — H409 Unspecified glaucoma: Secondary | ICD-10-CM | POA: Diagnosis not present

## 2015-02-14 DIAGNOSIS — I10 Essential (primary) hypertension: Secondary | ICD-10-CM | POA: Diagnosis not present

## 2015-02-14 DIAGNOSIS — M1990 Unspecified osteoarthritis, unspecified site: Secondary | ICD-10-CM | POA: Diagnosis not present

## 2015-02-15 DIAGNOSIS — M1990 Unspecified osteoarthritis, unspecified site: Secondary | ICD-10-CM | POA: Diagnosis not present

## 2015-02-15 DIAGNOSIS — Z96652 Presence of left artificial knee joint: Secondary | ICD-10-CM | POA: Diagnosis not present

## 2015-02-15 DIAGNOSIS — H409 Unspecified glaucoma: Secondary | ICD-10-CM | POA: Diagnosis not present

## 2015-02-15 DIAGNOSIS — Z471 Aftercare following joint replacement surgery: Secondary | ICD-10-CM | POA: Diagnosis not present

## 2015-02-15 DIAGNOSIS — I10 Essential (primary) hypertension: Secondary | ICD-10-CM | POA: Diagnosis not present

## 2015-02-15 DIAGNOSIS — H3530 Unspecified macular degeneration: Secondary | ICD-10-CM | POA: Diagnosis not present

## 2015-02-18 DIAGNOSIS — Z471 Aftercare following joint replacement surgery: Secondary | ICD-10-CM | POA: Diagnosis not present

## 2015-02-18 DIAGNOSIS — H3530 Unspecified macular degeneration: Secondary | ICD-10-CM | POA: Diagnosis not present

## 2015-02-18 DIAGNOSIS — Z96652 Presence of left artificial knee joint: Secondary | ICD-10-CM | POA: Diagnosis not present

## 2015-02-18 DIAGNOSIS — M1990 Unspecified osteoarthritis, unspecified site: Secondary | ICD-10-CM | POA: Diagnosis not present

## 2015-02-18 DIAGNOSIS — H409 Unspecified glaucoma: Secondary | ICD-10-CM | POA: Diagnosis not present

## 2015-02-18 DIAGNOSIS — I10 Essential (primary) hypertension: Secondary | ICD-10-CM | POA: Diagnosis not present

## 2015-02-19 DIAGNOSIS — Z96652 Presence of left artificial knee joint: Secondary | ICD-10-CM | POA: Diagnosis not present

## 2015-02-19 DIAGNOSIS — R262 Difficulty in walking, not elsewhere classified: Secondary | ICD-10-CM | POA: Diagnosis not present

## 2015-02-19 DIAGNOSIS — M25662 Stiffness of left knee, not elsewhere classified: Secondary | ICD-10-CM | POA: Diagnosis not present

## 2015-02-19 DIAGNOSIS — Z471 Aftercare following joint replacement surgery: Secondary | ICD-10-CM | POA: Diagnosis not present

## 2015-02-22 DIAGNOSIS — Z96652 Presence of left artificial knee joint: Secondary | ICD-10-CM | POA: Diagnosis not present

## 2015-02-22 DIAGNOSIS — R262 Difficulty in walking, not elsewhere classified: Secondary | ICD-10-CM | POA: Diagnosis not present

## 2015-02-22 DIAGNOSIS — M25662 Stiffness of left knee, not elsewhere classified: Secondary | ICD-10-CM | POA: Diagnosis not present

## 2015-02-27 DIAGNOSIS — Z96652 Presence of left artificial knee joint: Secondary | ICD-10-CM | POA: Diagnosis not present

## 2015-02-27 DIAGNOSIS — R262 Difficulty in walking, not elsewhere classified: Secondary | ICD-10-CM | POA: Diagnosis not present

## 2015-02-27 DIAGNOSIS — M25662 Stiffness of left knee, not elsewhere classified: Secondary | ICD-10-CM | POA: Diagnosis not present

## 2015-03-01 DIAGNOSIS — Z96652 Presence of left artificial knee joint: Secondary | ICD-10-CM | POA: Diagnosis not present

## 2015-03-01 DIAGNOSIS — R262 Difficulty in walking, not elsewhere classified: Secondary | ICD-10-CM | POA: Diagnosis not present

## 2015-03-01 DIAGNOSIS — M25662 Stiffness of left knee, not elsewhere classified: Secondary | ICD-10-CM | POA: Diagnosis not present

## 2015-03-04 DIAGNOSIS — M25662 Stiffness of left knee, not elsewhere classified: Secondary | ICD-10-CM | POA: Diagnosis not present

## 2015-03-04 DIAGNOSIS — R262 Difficulty in walking, not elsewhere classified: Secondary | ICD-10-CM | POA: Diagnosis not present

## 2015-03-04 DIAGNOSIS — Z96652 Presence of left artificial knee joint: Secondary | ICD-10-CM | POA: Diagnosis not present

## 2015-03-06 DIAGNOSIS — M25662 Stiffness of left knee, not elsewhere classified: Secondary | ICD-10-CM | POA: Diagnosis not present

## 2015-03-06 DIAGNOSIS — R262 Difficulty in walking, not elsewhere classified: Secondary | ICD-10-CM | POA: Diagnosis not present

## 2015-03-06 DIAGNOSIS — Z96652 Presence of left artificial knee joint: Secondary | ICD-10-CM | POA: Diagnosis not present

## 2015-03-08 DIAGNOSIS — M25662 Stiffness of left knee, not elsewhere classified: Secondary | ICD-10-CM | POA: Diagnosis not present

## 2015-03-08 DIAGNOSIS — R262 Difficulty in walking, not elsewhere classified: Secondary | ICD-10-CM | POA: Diagnosis not present

## 2015-03-08 DIAGNOSIS — Z96652 Presence of left artificial knee joint: Secondary | ICD-10-CM | POA: Diagnosis not present

## 2015-03-11 DIAGNOSIS — M25662 Stiffness of left knee, not elsewhere classified: Secondary | ICD-10-CM | POA: Diagnosis not present

## 2015-03-11 DIAGNOSIS — Z96652 Presence of left artificial knee joint: Secondary | ICD-10-CM | POA: Diagnosis not present

## 2015-03-11 DIAGNOSIS — R262 Difficulty in walking, not elsewhere classified: Secondary | ICD-10-CM | POA: Diagnosis not present

## 2015-03-13 DIAGNOSIS — R262 Difficulty in walking, not elsewhere classified: Secondary | ICD-10-CM | POA: Diagnosis not present

## 2015-03-13 DIAGNOSIS — Z96652 Presence of left artificial knee joint: Secondary | ICD-10-CM | POA: Diagnosis not present

## 2015-03-13 DIAGNOSIS — M25662 Stiffness of left knee, not elsewhere classified: Secondary | ICD-10-CM | POA: Diagnosis not present

## 2015-03-15 DIAGNOSIS — M25662 Stiffness of left knee, not elsewhere classified: Secondary | ICD-10-CM | POA: Diagnosis not present

## 2015-03-15 DIAGNOSIS — R262 Difficulty in walking, not elsewhere classified: Secondary | ICD-10-CM | POA: Diagnosis not present

## 2015-03-15 DIAGNOSIS — Z96652 Presence of left artificial knee joint: Secondary | ICD-10-CM | POA: Diagnosis not present

## 2015-03-18 DIAGNOSIS — R262 Difficulty in walking, not elsewhere classified: Secondary | ICD-10-CM | POA: Diagnosis not present

## 2015-03-18 DIAGNOSIS — M25662 Stiffness of left knee, not elsewhere classified: Secondary | ICD-10-CM | POA: Diagnosis not present

## 2015-03-18 DIAGNOSIS — Z96652 Presence of left artificial knee joint: Secondary | ICD-10-CM | POA: Diagnosis not present

## 2015-03-20 DIAGNOSIS — M25662 Stiffness of left knee, not elsewhere classified: Secondary | ICD-10-CM | POA: Diagnosis not present

## 2015-03-20 DIAGNOSIS — Z96652 Presence of left artificial knee joint: Secondary | ICD-10-CM | POA: Diagnosis not present

## 2015-03-20 DIAGNOSIS — R262 Difficulty in walking, not elsewhere classified: Secondary | ICD-10-CM | POA: Diagnosis not present

## 2015-03-22 DIAGNOSIS — M25662 Stiffness of left knee, not elsewhere classified: Secondary | ICD-10-CM | POA: Diagnosis not present

## 2015-03-22 DIAGNOSIS — R262 Difficulty in walking, not elsewhere classified: Secondary | ICD-10-CM | POA: Diagnosis not present

## 2015-03-25 DIAGNOSIS — M25662 Stiffness of left knee, not elsewhere classified: Secondary | ICD-10-CM | POA: Diagnosis not present

## 2015-03-25 DIAGNOSIS — R262 Difficulty in walking, not elsewhere classified: Secondary | ICD-10-CM | POA: Diagnosis not present

## 2015-03-25 DIAGNOSIS — Z96652 Presence of left artificial knee joint: Secondary | ICD-10-CM | POA: Diagnosis not present

## 2015-03-27 DIAGNOSIS — R262 Difficulty in walking, not elsewhere classified: Secondary | ICD-10-CM | POA: Diagnosis not present

## 2015-03-27 DIAGNOSIS — M25662 Stiffness of left knee, not elsewhere classified: Secondary | ICD-10-CM | POA: Diagnosis not present

## 2015-03-27 DIAGNOSIS — Z96652 Presence of left artificial knee joint: Secondary | ICD-10-CM | POA: Diagnosis not present

## 2015-03-29 DIAGNOSIS — M25662 Stiffness of left knee, not elsewhere classified: Secondary | ICD-10-CM | POA: Diagnosis not present

## 2015-03-29 DIAGNOSIS — R262 Difficulty in walking, not elsewhere classified: Secondary | ICD-10-CM | POA: Diagnosis not present

## 2015-03-29 DIAGNOSIS — Z96652 Presence of left artificial knee joint: Secondary | ICD-10-CM | POA: Diagnosis not present

## 2015-04-02 DIAGNOSIS — R262 Difficulty in walking, not elsewhere classified: Secondary | ICD-10-CM | POA: Diagnosis not present

## 2015-04-02 DIAGNOSIS — Z96652 Presence of left artificial knee joint: Secondary | ICD-10-CM | POA: Diagnosis not present

## 2015-04-02 DIAGNOSIS — M25662 Stiffness of left knee, not elsewhere classified: Secondary | ICD-10-CM | POA: Diagnosis not present

## 2015-04-03 DIAGNOSIS — Z96652 Presence of left artificial knee joint: Secondary | ICD-10-CM | POA: Diagnosis not present

## 2015-04-03 DIAGNOSIS — R262 Difficulty in walking, not elsewhere classified: Secondary | ICD-10-CM | POA: Diagnosis not present

## 2015-04-03 DIAGNOSIS — M25662 Stiffness of left knee, not elsewhere classified: Secondary | ICD-10-CM | POA: Diagnosis not present

## 2015-04-05 DIAGNOSIS — M25662 Stiffness of left knee, not elsewhere classified: Secondary | ICD-10-CM | POA: Diagnosis not present

## 2015-04-05 DIAGNOSIS — R262 Difficulty in walking, not elsewhere classified: Secondary | ICD-10-CM | POA: Diagnosis not present

## 2015-04-05 DIAGNOSIS — Z96652 Presence of left artificial knee joint: Secondary | ICD-10-CM | POA: Diagnosis not present

## 2015-04-08 DIAGNOSIS — M25662 Stiffness of left knee, not elsewhere classified: Secondary | ICD-10-CM | POA: Diagnosis not present

## 2015-04-08 DIAGNOSIS — R262 Difficulty in walking, not elsewhere classified: Secondary | ICD-10-CM | POA: Diagnosis not present

## 2015-04-08 DIAGNOSIS — Z96652 Presence of left artificial knee joint: Secondary | ICD-10-CM | POA: Diagnosis not present

## 2015-04-10 DIAGNOSIS — R262 Difficulty in walking, not elsewhere classified: Secondary | ICD-10-CM | POA: Diagnosis not present

## 2015-04-10 DIAGNOSIS — M25662 Stiffness of left knee, not elsewhere classified: Secondary | ICD-10-CM | POA: Diagnosis not present

## 2015-04-10 DIAGNOSIS — Z96652 Presence of left artificial knee joint: Secondary | ICD-10-CM | POA: Diagnosis not present

## 2015-04-12 DIAGNOSIS — M25662 Stiffness of left knee, not elsewhere classified: Secondary | ICD-10-CM | POA: Diagnosis not present

## 2015-04-12 DIAGNOSIS — R262 Difficulty in walking, not elsewhere classified: Secondary | ICD-10-CM | POA: Diagnosis not present

## 2015-04-12 DIAGNOSIS — Z96652 Presence of left artificial knee joint: Secondary | ICD-10-CM | POA: Diagnosis not present

## 2015-04-15 DIAGNOSIS — Z96652 Presence of left artificial knee joint: Secondary | ICD-10-CM | POA: Diagnosis not present

## 2015-04-15 DIAGNOSIS — R262 Difficulty in walking, not elsewhere classified: Secondary | ICD-10-CM | POA: Diagnosis not present

## 2015-04-15 DIAGNOSIS — M25662 Stiffness of left knee, not elsewhere classified: Secondary | ICD-10-CM | POA: Diagnosis not present

## 2015-04-16 DIAGNOSIS — R262 Difficulty in walking, not elsewhere classified: Secondary | ICD-10-CM | POA: Diagnosis not present

## 2015-04-16 DIAGNOSIS — M25662 Stiffness of left knee, not elsewhere classified: Secondary | ICD-10-CM | POA: Diagnosis not present

## 2015-04-16 DIAGNOSIS — Z96652 Presence of left artificial knee joint: Secondary | ICD-10-CM | POA: Diagnosis not present

## 2015-04-19 DIAGNOSIS — M25662 Stiffness of left knee, not elsewhere classified: Secondary | ICD-10-CM | POA: Diagnosis not present

## 2015-04-19 DIAGNOSIS — Z96652 Presence of left artificial knee joint: Secondary | ICD-10-CM | POA: Diagnosis not present

## 2015-04-19 DIAGNOSIS — R262 Difficulty in walking, not elsewhere classified: Secondary | ICD-10-CM | POA: Diagnosis not present

## 2015-04-22 DIAGNOSIS — M25662 Stiffness of left knee, not elsewhere classified: Secondary | ICD-10-CM | POA: Diagnosis not present

## 2015-04-22 DIAGNOSIS — R262 Difficulty in walking, not elsewhere classified: Secondary | ICD-10-CM | POA: Diagnosis not present

## 2015-04-22 DIAGNOSIS — Z96652 Presence of left artificial knee joint: Secondary | ICD-10-CM | POA: Diagnosis not present

## 2015-04-24 DIAGNOSIS — M25662 Stiffness of left knee, not elsewhere classified: Secondary | ICD-10-CM | POA: Diagnosis not present

## 2015-04-24 DIAGNOSIS — Z96652 Presence of left artificial knee joint: Secondary | ICD-10-CM | POA: Diagnosis not present

## 2015-04-24 DIAGNOSIS — R262 Difficulty in walking, not elsewhere classified: Secondary | ICD-10-CM | POA: Diagnosis not present

## 2015-04-25 DIAGNOSIS — M25662 Stiffness of left knee, not elsewhere classified: Secondary | ICD-10-CM | POA: Diagnosis not present

## 2015-04-25 DIAGNOSIS — Z96652 Presence of left artificial knee joint: Secondary | ICD-10-CM | POA: Diagnosis not present

## 2015-04-25 DIAGNOSIS — R262 Difficulty in walking, not elsewhere classified: Secondary | ICD-10-CM | POA: Diagnosis not present

## 2015-11-03 IMAGING — US US AORTA SCREENING (MEDICARE)
1 series · 14 of 25 positions shown · non-contrast
Comparison: None.

CLINICAL DATA: Medicare screening exam for abdominal aortic
aneurysm.

EXAM:
ABDOMINAL AORTA SCREENING ULTRASOUND
TECHNIQUE: Ultrasound examination of the abdominal aorta was performed as a
screening evaluation for abdominal aortic aneurysm.

[Series 1: us aorta screening (medicare) · 0.30mm/px · 14 of 32 slices shown]
[im 1/32]
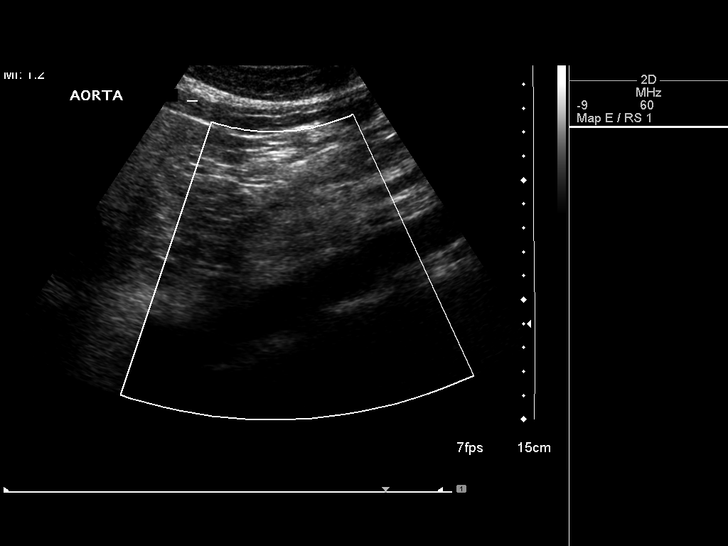
[im 3/32]
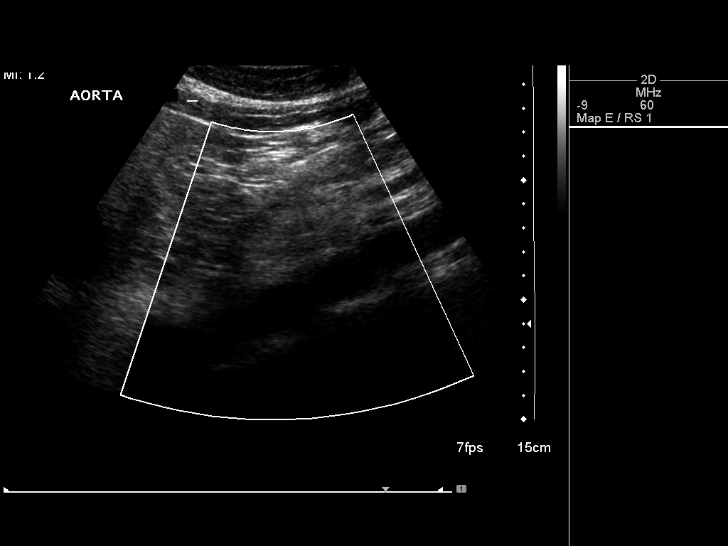
[im 6/32]
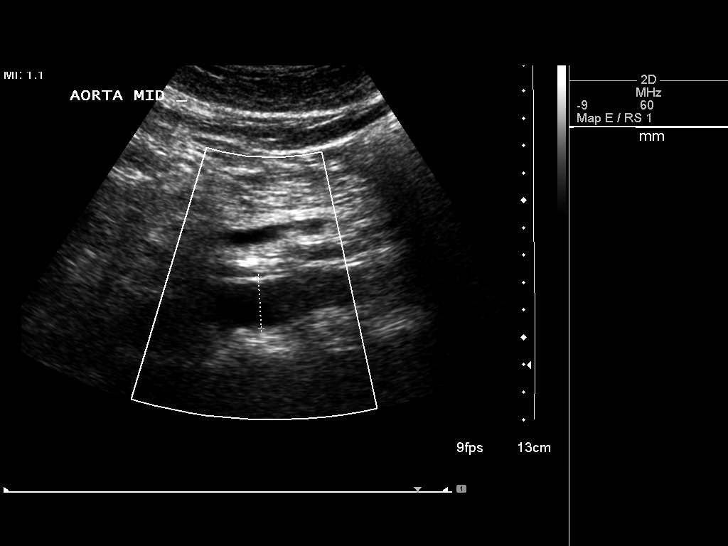
[im 8/32]
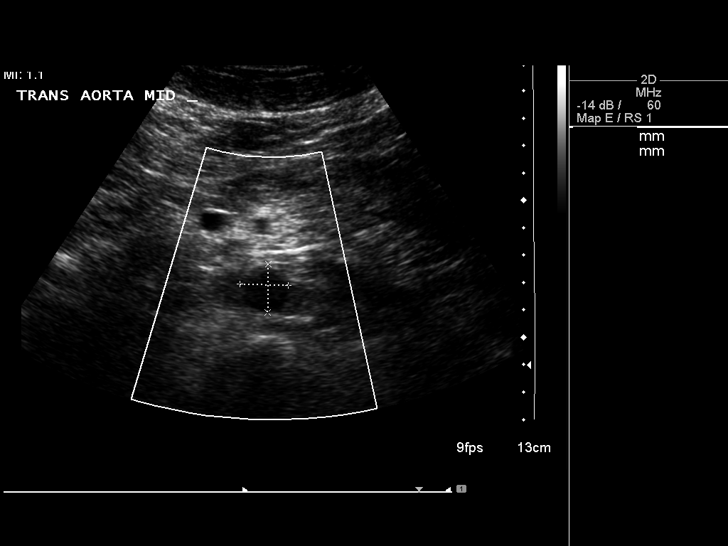
[im 11/32]
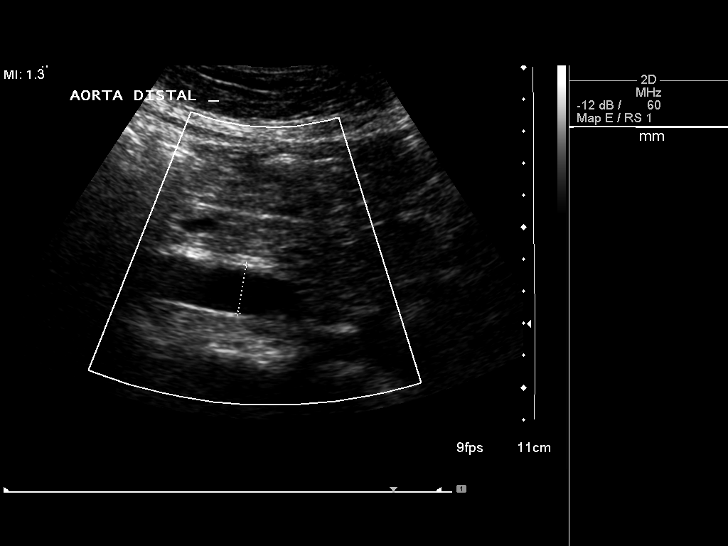
[im 12/32]
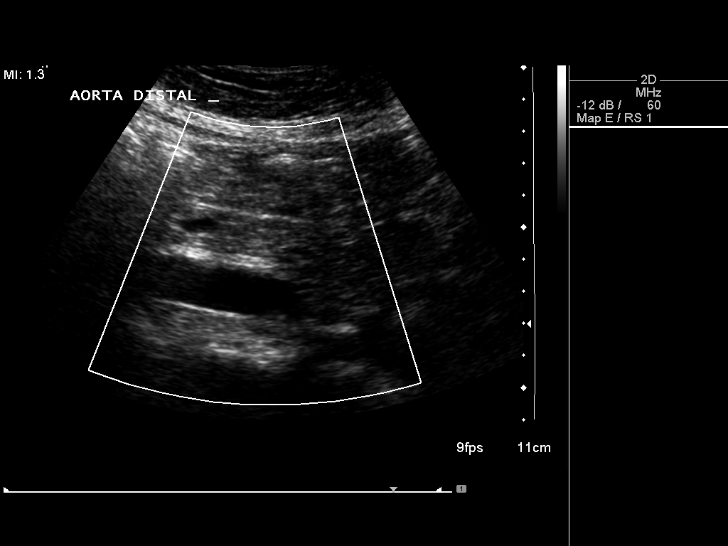
[im 15/32]
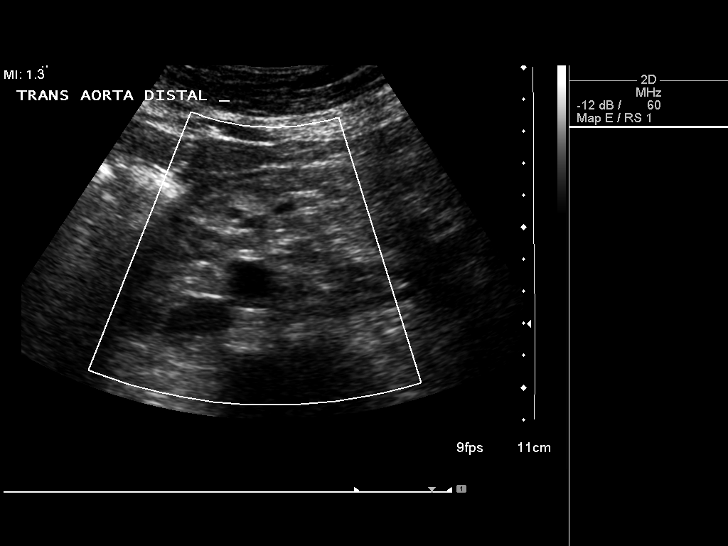
[im 17/32]
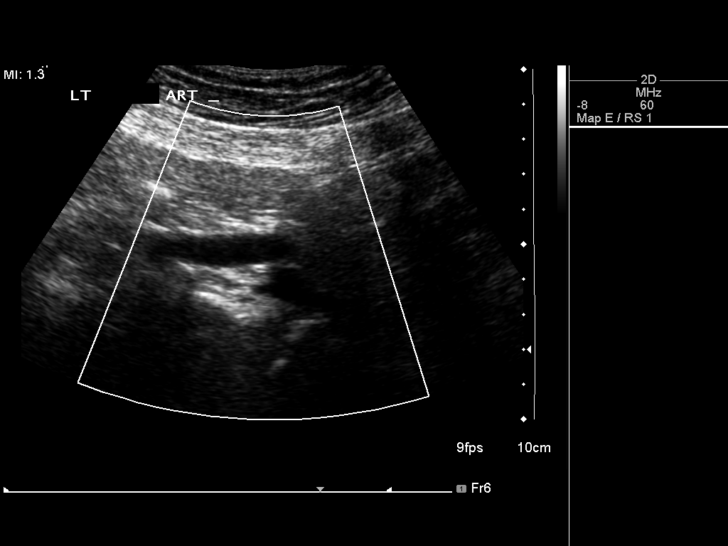
[im 20/32]
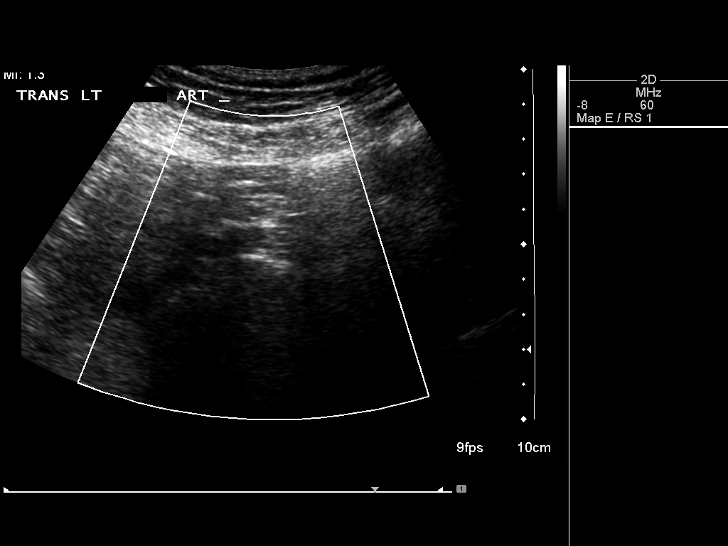
[im 21/32]
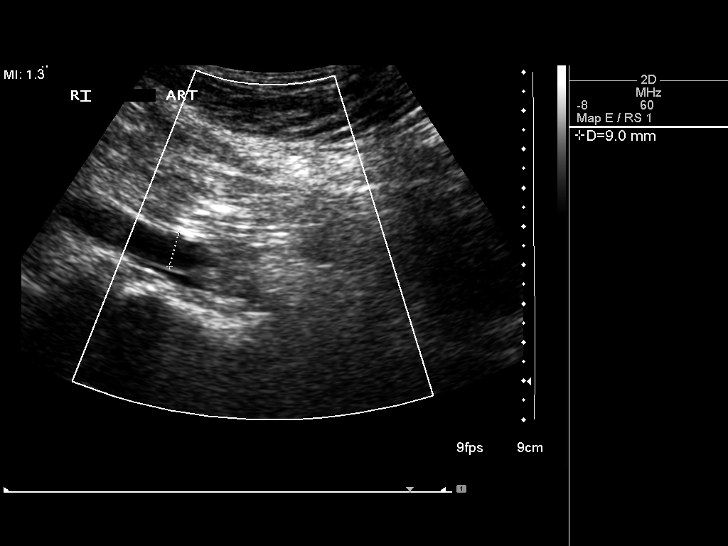
[im 24/32]
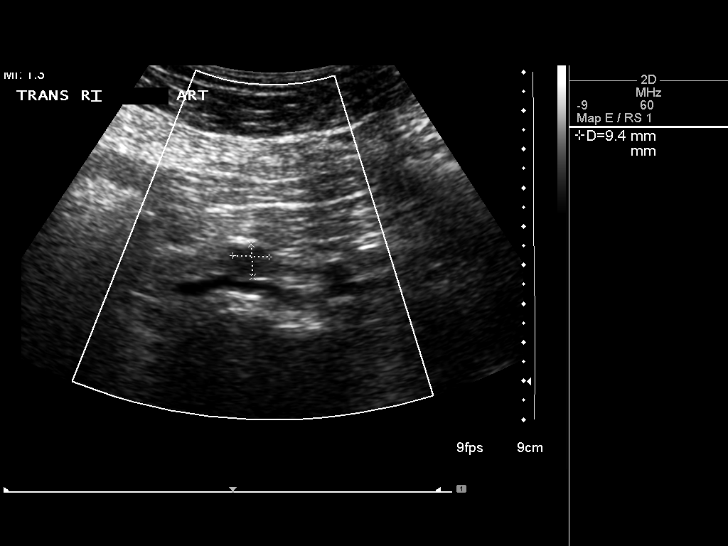
[im 26/32]
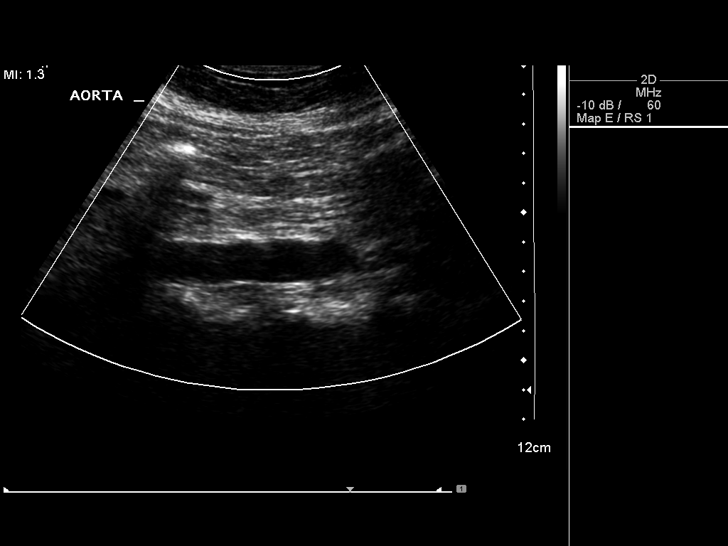
[im 29/32]
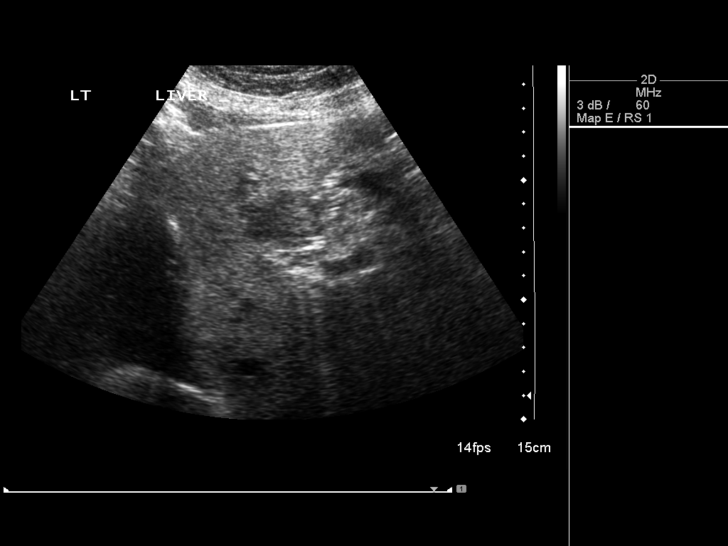
[im 32/32]
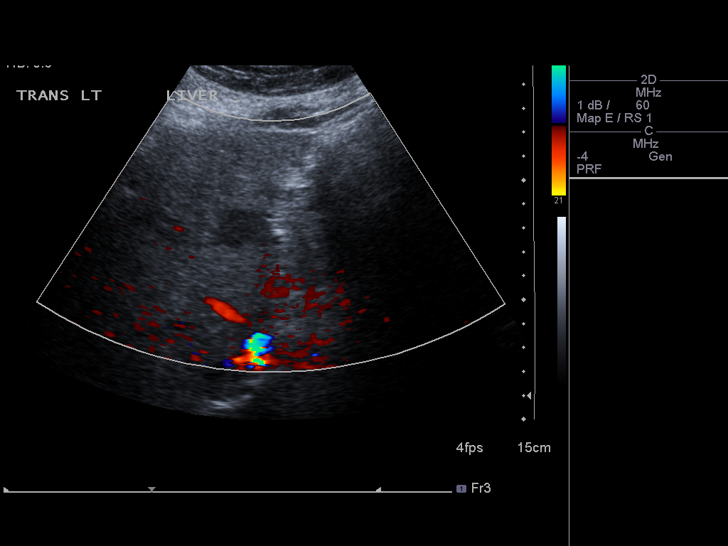

[14 of 25 positions shown; findings below may reference images not displayed]

FINDINGS: Abdominal Aorta

No aneurysm identified.

Maximum AP

Diameter:  2.3 cm

Maximum TRV

Diameter: 2.6 cm

Incidental note is made of a 3.4 x 2.4 cm area of decreased
echogenicity within the left lobe of the liver along the falciform
ligament. This likely represents an area of focal fatty sparing.
IMPRESSION: No abdominal aortic aneurysm is noted.

Changes suggestive of an area of focal fatty sparing. Short-term
followup is recommended.

## 2016-06-18 IMAGING — CR DG CHEST 2V
2 series · 2 of 2 positions shown · non-contrast
Comparison: None.

CLINICAL DATA: Preoperative evaluation for left knee replacement

EXAM:
CHEST  2 VIEW

[w chest pa]
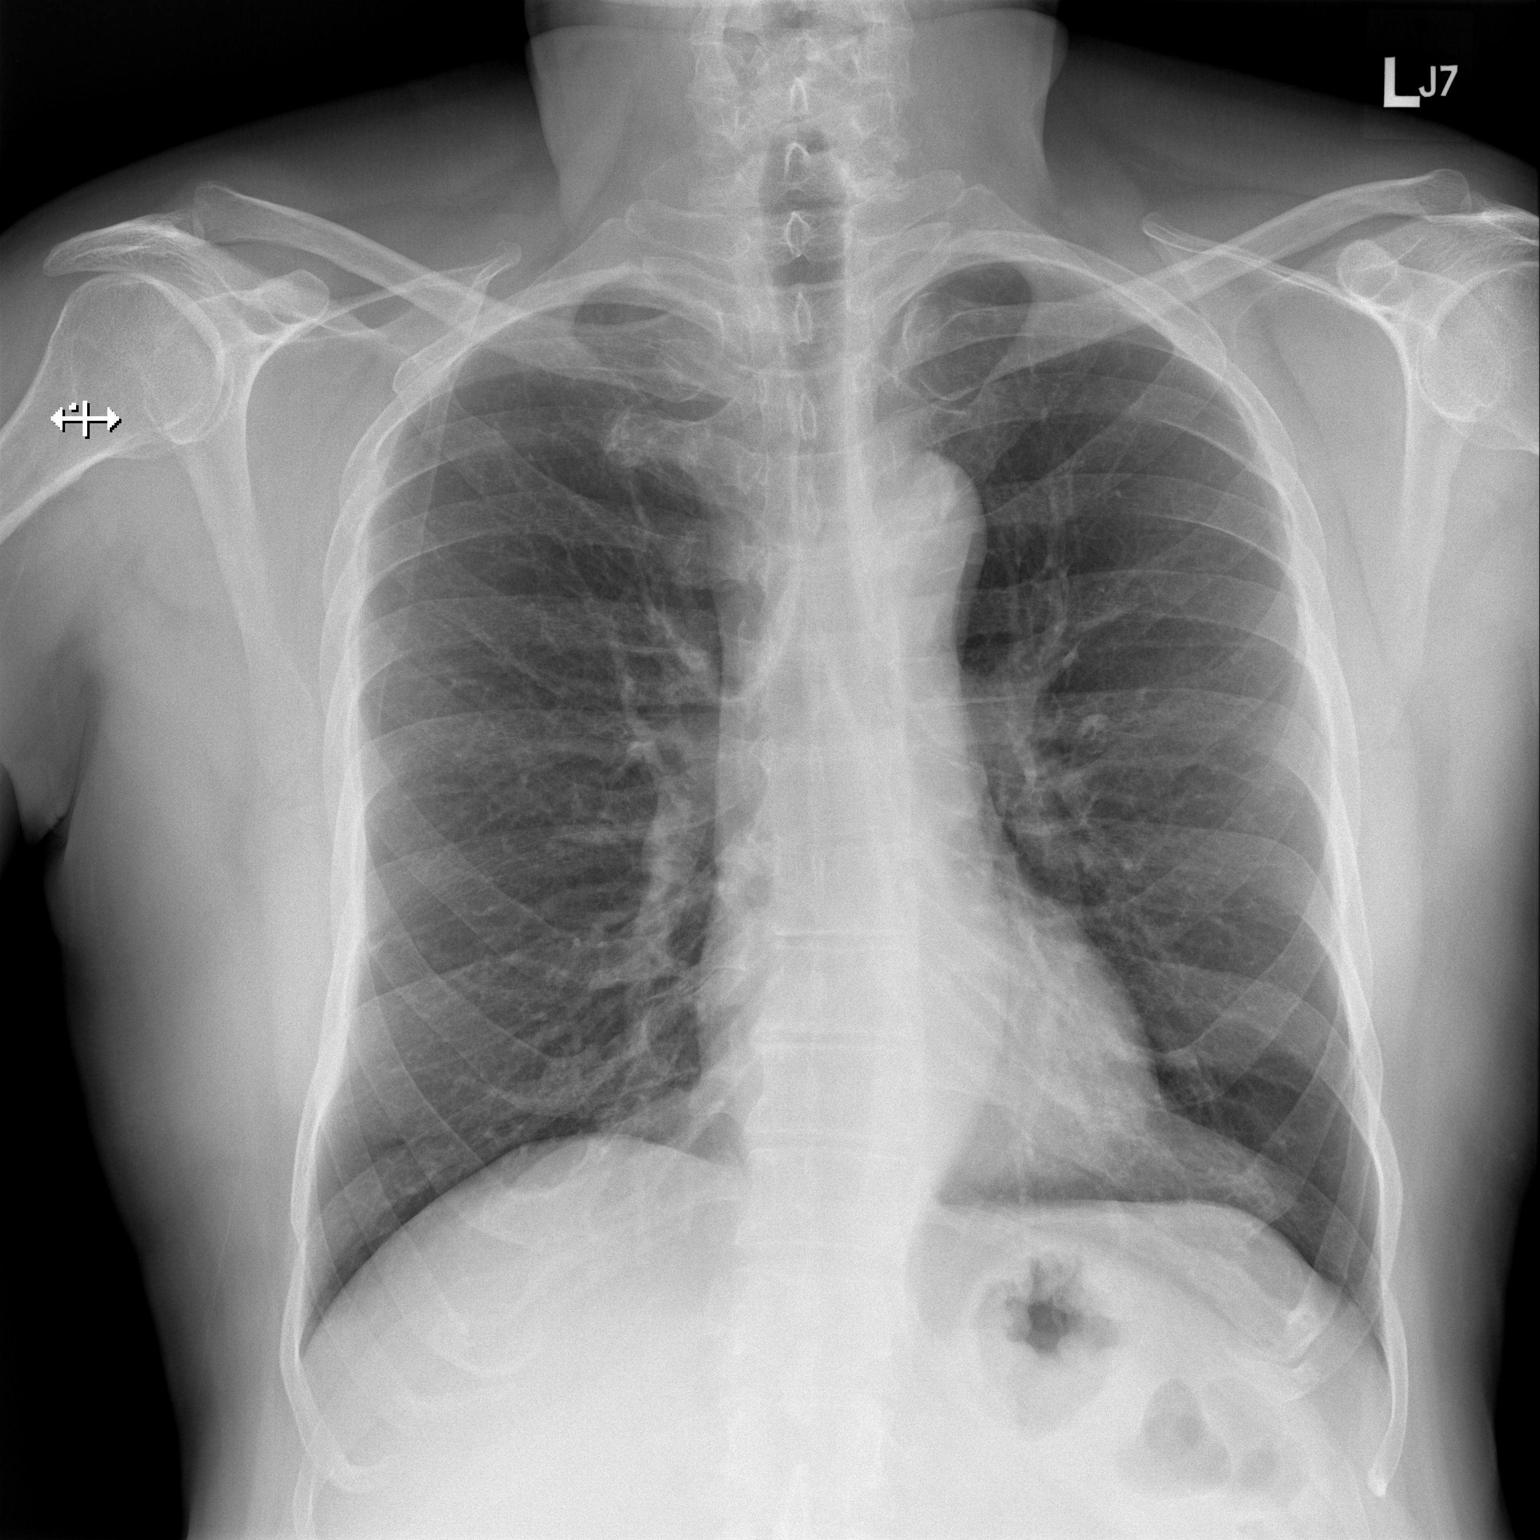

[w chest lat]
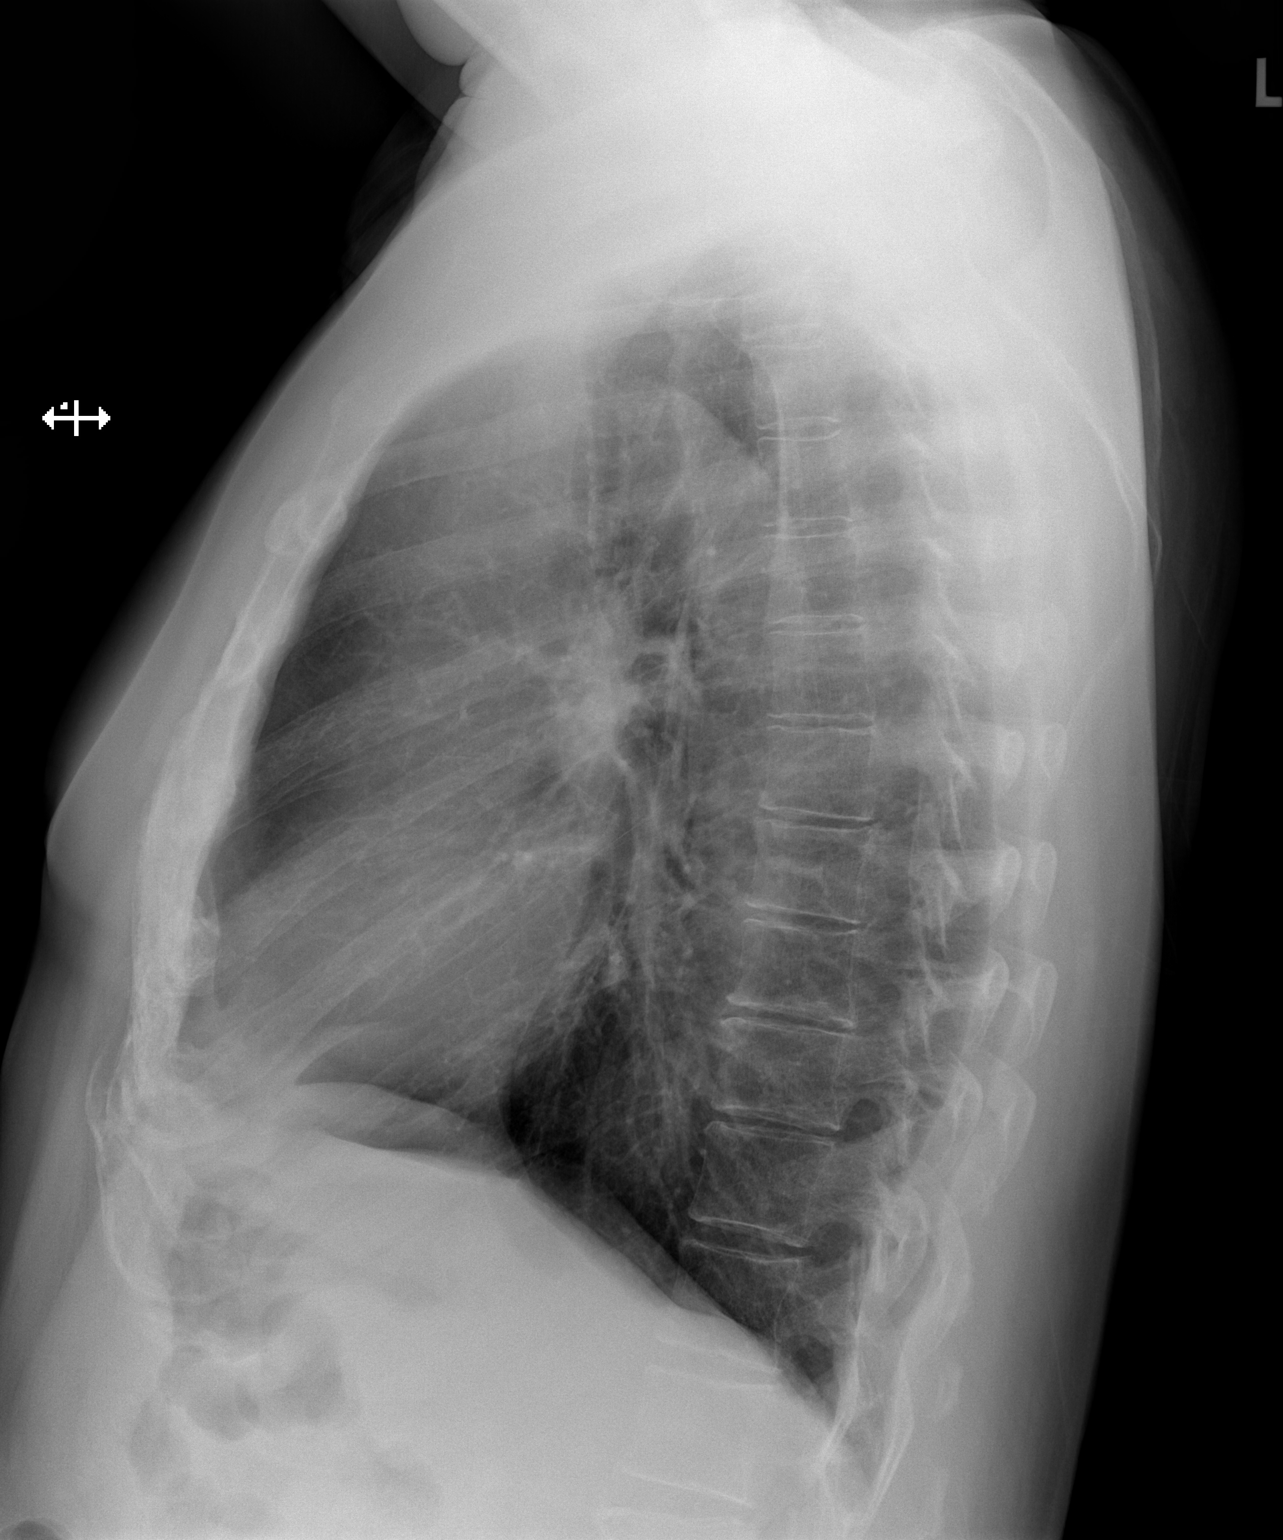

[2 of 2 positions shown; findings below may reference images not displayed]

FINDINGS: The heart size and mediastinal contours are within normal limits.
Both lungs are clear. The visualized skeletal structures are
unremarkable.
IMPRESSION: No active cardiopulmonary disease.

## 2017-07-13 DIAGNOSIS — Z1211 Encounter for screening for malignant neoplasm of colon: Secondary | ICD-10-CM | POA: Diagnosis not present

## 2017-07-13 DIAGNOSIS — K210 Gastro-esophageal reflux disease with esophagitis: Secondary | ICD-10-CM | POA: Diagnosis not present

## 2017-07-13 DIAGNOSIS — Z86010 Personal history of colonic polyps: Secondary | ICD-10-CM | POA: Diagnosis not present

## 2017-07-13 DIAGNOSIS — J383 Other diseases of vocal cords: Secondary | ICD-10-CM | POA: Diagnosis not present

## 2017-07-15 DIAGNOSIS — K2970 Gastritis, unspecified, without bleeding: Secondary | ICD-10-CM | POA: Diagnosis not present

## 2017-07-15 DIAGNOSIS — K210 Gastro-esophageal reflux disease with esophagitis: Secondary | ICD-10-CM | POA: Diagnosis not present

## 2017-07-15 DIAGNOSIS — K3189 Other diseases of stomach and duodenum: Secondary | ICD-10-CM | POA: Diagnosis not present

## 2017-07-15 DIAGNOSIS — Z86010 Personal history of colonic polyps: Secondary | ICD-10-CM | POA: Diagnosis not present

## 2017-07-15 DIAGNOSIS — Z87891 Personal history of nicotine dependence: Secondary | ICD-10-CM | POA: Diagnosis not present

## 2017-07-15 DIAGNOSIS — I10 Essential (primary) hypertension: Secondary | ICD-10-CM | POA: Diagnosis not present

## 2017-07-15 DIAGNOSIS — K648 Other hemorrhoids: Secondary | ICD-10-CM | POA: Diagnosis not present

## 2017-07-15 DIAGNOSIS — K219 Gastro-esophageal reflux disease without esophagitis: Secondary | ICD-10-CM | POA: Diagnosis not present

## 2017-07-15 DIAGNOSIS — K635 Polyp of colon: Secondary | ICD-10-CM | POA: Diagnosis not present

## 2017-07-15 DIAGNOSIS — F419 Anxiety disorder, unspecified: Secondary | ICD-10-CM | POA: Diagnosis not present

## 2017-07-15 DIAGNOSIS — D123 Benign neoplasm of transverse colon: Secondary | ICD-10-CM | POA: Diagnosis not present

## 2017-07-15 DIAGNOSIS — E7849 Other hyperlipidemia: Secondary | ICD-10-CM | POA: Diagnosis not present

## 2017-07-15 DIAGNOSIS — G4700 Insomnia, unspecified: Secondary | ICD-10-CM | POA: Diagnosis not present

## 2017-07-15 DIAGNOSIS — K644 Residual hemorrhoidal skin tags: Secondary | ICD-10-CM | POA: Diagnosis not present

## 2017-07-15 DIAGNOSIS — J329 Chronic sinusitis, unspecified: Secondary | ICD-10-CM | POA: Diagnosis not present

## 2017-07-15 DIAGNOSIS — Z1211 Encounter for screening for malignant neoplasm of colon: Secondary | ICD-10-CM | POA: Diagnosis not present

## 2017-07-19 DIAGNOSIS — Z23 Encounter for immunization: Secondary | ICD-10-CM | POA: Diagnosis not present

## 2017-08-02 DIAGNOSIS — S46911A Strain of unspecified muscle, fascia and tendon at shoulder and upper arm level, right arm, initial encounter: Secondary | ICD-10-CM | POA: Diagnosis not present

## 2017-08-02 DIAGNOSIS — M25511 Pain in right shoulder: Secondary | ICD-10-CM | POA: Diagnosis not present

## 2017-08-04 DIAGNOSIS — M150 Primary generalized (osteo)arthritis: Secondary | ICD-10-CM | POA: Diagnosis not present

## 2017-08-04 DIAGNOSIS — H4010X0 Unspecified open-angle glaucoma, stage unspecified: Secondary | ICD-10-CM | POA: Diagnosis not present

## 2017-08-04 DIAGNOSIS — Z125 Encounter for screening for malignant neoplasm of prostate: Secondary | ICD-10-CM | POA: Diagnosis not present

## 2017-08-04 DIAGNOSIS — I10 Essential (primary) hypertension: Secondary | ICD-10-CM | POA: Diagnosis not present

## 2017-08-04 DIAGNOSIS — R109 Unspecified abdominal pain: Secondary | ICD-10-CM | POA: Diagnosis not present

## 2017-08-04 DIAGNOSIS — E7800 Pure hypercholesterolemia, unspecified: Secondary | ICD-10-CM | POA: Diagnosis not present

## 2017-08-04 DIAGNOSIS — R1900 Intra-abdominal and pelvic swelling, mass and lump, unspecified site: Secondary | ICD-10-CM | POA: Diagnosis not present

## 2017-08-04 DIAGNOSIS — K219 Gastro-esophageal reflux disease without esophagitis: Secondary | ICD-10-CM | POA: Diagnosis not present

## 2017-08-04 DIAGNOSIS — J383 Other diseases of vocal cords: Secondary | ICD-10-CM | POA: Diagnosis not present

## 2017-08-11 DIAGNOSIS — R1900 Intra-abdominal and pelvic swelling, mass and lump, unspecified site: Secondary | ICD-10-CM | POA: Diagnosis not present

## 2017-08-12 DIAGNOSIS — M25512 Pain in left shoulder: Secondary | ICD-10-CM | POA: Diagnosis not present

## 2017-08-12 DIAGNOSIS — M7541 Impingement syndrome of right shoulder: Secondary | ICD-10-CM | POA: Diagnosis not present

## 2017-08-12 DIAGNOSIS — M542 Cervicalgia: Secondary | ICD-10-CM | POA: Diagnosis not present

## 2017-08-13 DIAGNOSIS — R109 Unspecified abdominal pain: Secondary | ICD-10-CM | POA: Diagnosis not present

## 2017-08-13 DIAGNOSIS — I10 Essential (primary) hypertension: Secondary | ICD-10-CM | POA: Diagnosis not present

## 2017-08-13 DIAGNOSIS — Z125 Encounter for screening for malignant neoplasm of prostate: Secondary | ICD-10-CM | POA: Diagnosis not present

## 2017-08-13 DIAGNOSIS — E7800 Pure hypercholesterolemia, unspecified: Secondary | ICD-10-CM | POA: Diagnosis not present

## 2017-08-16 DIAGNOSIS — M542 Cervicalgia: Secondary | ICD-10-CM | POA: Diagnosis not present

## 2017-08-16 DIAGNOSIS — M7541 Impingement syndrome of right shoulder: Secondary | ICD-10-CM | POA: Diagnosis not present

## 2017-08-16 DIAGNOSIS — M25512 Pain in left shoulder: Secondary | ICD-10-CM | POA: Diagnosis not present

## 2017-08-18 DIAGNOSIS — M25512 Pain in left shoulder: Secondary | ICD-10-CM | POA: Diagnosis not present

## 2017-08-18 DIAGNOSIS — H04123 Dry eye syndrome of bilateral lacrimal glands: Secondary | ICD-10-CM | POA: Diagnosis not present

## 2017-08-18 DIAGNOSIS — H401133 Primary open-angle glaucoma, bilateral, severe stage: Secondary | ICD-10-CM | POA: Diagnosis not present

## 2017-08-18 DIAGNOSIS — M542 Cervicalgia: Secondary | ICD-10-CM | POA: Diagnosis not present

## 2017-08-18 DIAGNOSIS — M7541 Impingement syndrome of right shoulder: Secondary | ICD-10-CM | POA: Diagnosis not present

## 2017-08-18 DIAGNOSIS — Z961 Presence of intraocular lens: Secondary | ICD-10-CM | POA: Diagnosis not present

## 2017-08-23 DIAGNOSIS — M542 Cervicalgia: Secondary | ICD-10-CM | POA: Diagnosis not present

## 2017-08-23 DIAGNOSIS — M7541 Impingement syndrome of right shoulder: Secondary | ICD-10-CM | POA: Diagnosis not present

## 2017-08-23 DIAGNOSIS — K219 Gastro-esophageal reflux disease without esophagitis: Secondary | ICD-10-CM | POA: Diagnosis not present

## 2017-08-23 DIAGNOSIS — M150 Primary generalized (osteo)arthritis: Secondary | ICD-10-CM | POA: Diagnosis not present

## 2017-08-23 DIAGNOSIS — E7800 Pure hypercholesterolemia, unspecified: Secondary | ICD-10-CM | POA: Diagnosis not present

## 2017-08-23 DIAGNOSIS — M25512 Pain in left shoulder: Secondary | ICD-10-CM | POA: Diagnosis not present

## 2017-08-23 DIAGNOSIS — I10 Essential (primary) hypertension: Secondary | ICD-10-CM | POA: Diagnosis not present
